# Patient Record
Sex: Female | Born: 1995 | Race: White | Hispanic: No | Marital: Single | Smoking: Never smoker
Health system: Southern US, Community
[De-identification: ages and names within clinical notes are randomized; demographics above are authoritative.]

## PROBLEM LIST (undated history)

## (undated) DIAGNOSIS — I69359 Hemiplegia and hemiparesis following cerebral infarction affecting unspecified side: Secondary | ICD-10-CM

## (undated) DIAGNOSIS — F32A Depression, unspecified: Secondary | ICD-10-CM

## (undated) DIAGNOSIS — I619 Nontraumatic intracerebral hemorrhage, unspecified: Secondary | ICD-10-CM

## (undated) DIAGNOSIS — G8194 Hemiplegia, unspecified affecting left nondominant side: Secondary | ICD-10-CM

## (undated) DIAGNOSIS — F909 Attention-deficit hyperactivity disorder, unspecified type: Secondary | ICD-10-CM

## (undated) DIAGNOSIS — M2142 Flat foot [pes planus] (acquired), left foot: Secondary | ICD-10-CM

## (undated) DIAGNOSIS — M2120 Flexion deformity, unspecified site: Secondary | ICD-10-CM

## (undated) DIAGNOSIS — G40109 Localization-related (focal) (partial) symptomatic epilepsy and epileptic syndromes with simple partial seizures, not intractable, without status epilepticus: Secondary | ICD-10-CM

## (undated) DIAGNOSIS — F419 Anxiety disorder, unspecified: Secondary | ICD-10-CM

## (undated) DIAGNOSIS — M216X2 Other acquired deformities of left foot: Secondary | ICD-10-CM

## (undated) DIAGNOSIS — G801 Spastic diplegic cerebral palsy: Secondary | ICD-10-CM

## (undated) DIAGNOSIS — F329 Major depressive disorder, single episode, unspecified: Secondary | ICD-10-CM

## (undated) HISTORY — PX: OTHER SURGICAL HISTORY: SHX169

## (undated) HISTORY — DX: Nontraumatic intracerebral hemorrhage, unspecified: I61.9

## (undated) HISTORY — DX: Attention-deficit hyperactivity disorder, unspecified type: F90.9

## (undated) HISTORY — DX: Flat foot (pes planus) (acquired), left foot: M21.42

## (undated) HISTORY — DX: Hemiplegia and hemiparesis following cerebral infarction affecting unspecified side: I69.359

## (undated) HISTORY — DX: Hemiplegia, unspecified affecting left nondominant side: G81.94

## (undated) HISTORY — DX: Localization-related (focal) (partial) symptomatic epilepsy and epileptic syndromes with simple partial seizures, not intractable, without status epilepticus: G40.109

## (undated) HISTORY — DX: Flexion deformity, unspecified site: M21.20

## (undated) HISTORY — DX: Major depressive disorder, single episode, unspecified: F32.9

## (undated) HISTORY — DX: Other acquired deformities of left foot: M21.6X2

## (undated) HISTORY — DX: Spastic diplegic cerebral palsy: G80.1

## (undated) HISTORY — DX: Depression, unspecified: F32.A

## (undated) HISTORY — DX: Anxiety disorder, unspecified: F41.9

---
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
## 2017-01-07 ENCOUNTER — Encounter (HOSPITAL_COMMUNITY): Payer: Self-pay | Admitting: Medical

## 2017-01-07 ENCOUNTER — Ambulatory Visit (INDEPENDENT_AMBULATORY_CARE_PROVIDER_SITE_OTHER): Payer: 59 | Admitting: Medical

## 2017-01-07 VITALS — BP 120/72 | HR 92 | Resp 16 | Ht 58.5 in | Wt 203.0 lb

## 2017-01-07 DIAGNOSIS — F509 Eating disorder, unspecified: Secondary | ICD-10-CM

## 2017-01-07 DIAGNOSIS — M216X2 Other acquired deformities of left foot: Secondary | ICD-10-CM | POA: Diagnosis not present

## 2017-01-07 DIAGNOSIS — G40109 Localization-related (focal) (partial) symptomatic epilepsy and epileptic syndromes with simple partial seizures, not intractable, without status epilepticus: Secondary | ICD-10-CM

## 2017-01-07 DIAGNOSIS — I69359 Hemiplegia and hemiparesis following cerebral infarction affecting unspecified side: Secondary | ICD-10-CM | POA: Diagnosis not present

## 2017-01-07 DIAGNOSIS — G43809 Other migraine, not intractable, without status migrainosus: Secondary | ICD-10-CM | POA: Diagnosis not present

## 2017-01-07 DIAGNOSIS — H60391 Other infective otitis externa, right ear: Secondary | ICD-10-CM | POA: Diagnosis not present

## 2017-01-07 DIAGNOSIS — F840 Autistic disorder: Secondary | ICD-10-CM

## 2017-01-07 DIAGNOSIS — F639 Impulse disorder, unspecified: Secondary | ICD-10-CM

## 2017-01-07 DIAGNOSIS — F411 Generalized anxiety disorder: Secondary | ICD-10-CM

## 2017-01-07 DIAGNOSIS — M24522 Contracture, left elbow: Secondary | ICD-10-CM | POA: Diagnosis not present

## 2017-01-07 DIAGNOSIS — F902 Attention-deficit hyperactivity disorder, combined type: Secondary | ICD-10-CM | POA: Diagnosis not present

## 2017-01-07 DIAGNOSIS — Z8659 Personal history of other mental and behavioral disorders: Secondary | ICD-10-CM

## 2017-01-07 DIAGNOSIS — G801 Spastic diplegic cerebral palsy: Secondary | ICD-10-CM

## 2017-01-07 MED ORDER — LISDEXAMFETAMINE DIMESYLATE 60 MG PO CAPS: 60.0000 mg | ORAL_CAPSULE | ORAL | 0 refills | Status: DC

## 2017-01-07 MED ORDER — FLUOXETINE HCL 20 MG PO CAPS: 20.0000 mg | ORAL_CAPSULE | Freq: Every day | ORAL | 3 refills | Status: DC

## 2017-01-07 MED ORDER — LISDEXAMFETAMINE DIMESYLATE 60 MG PO CAPS
60.0000 mg | ORAL_CAPSULE | ORAL | 0 refills | Status: DC
Start: 2017-01-07 — End: 2017-04-01

## 2017-01-07 MED ORDER — FLUOXETINE HCL 40 MG PO CAPS: 40.0000 mg | ORAL_CAPSULE | Freq: Every day | ORAL | 3 refills | Status: DC

## 2017-01-07 MED ORDER — TOPIRAMATE 50 MG PO TABS: 50.0000 mg | ORAL_TABLET | Freq: Two times a day (BID) | ORAL | 1 refills | Status: DC
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
## 2017-03-22 DIAGNOSIS — G4733 Obstructive sleep apnea (adult) (pediatric): Secondary | ICD-10-CM | POA: Insufficient documentation

## 2017-04-01 ENCOUNTER — Ambulatory Visit (INDEPENDENT_AMBULATORY_CARE_PROVIDER_SITE_OTHER): Payer: 59 | Admitting: Medical

## 2017-04-01 ENCOUNTER — Encounter (HOSPITAL_COMMUNITY): Payer: Self-pay | Admitting: Medical

## 2017-04-01 VITALS — BP 132/80 | HR 91 | Ht 60.0 in | Wt 208.0 lb

## 2017-04-01 DIAGNOSIS — F509 Eating disorder, unspecified: Secondary | ICD-10-CM

## 2017-04-01 DIAGNOSIS — G801 Spastic diplegic cerebral palsy: Secondary | ICD-10-CM | POA: Diagnosis not present

## 2017-04-01 DIAGNOSIS — I69359 Hemiplegia and hemiparesis following cerebral infarction affecting unspecified side: Secondary | ICD-10-CM | POA: Diagnosis not present

## 2017-04-01 DIAGNOSIS — Z818 Family history of other mental and behavioral disorders: Secondary | ICD-10-CM

## 2017-04-01 DIAGNOSIS — G4733 Obstructive sleep apnea (adult) (pediatric): Secondary | ICD-10-CM | POA: Diagnosis not present

## 2017-04-01 DIAGNOSIS — F902 Attention-deficit hyperactivity disorder, combined type: Secondary | ICD-10-CM

## 2017-04-01 DIAGNOSIS — M216X2 Other acquired deformities of left foot: Secondary | ICD-10-CM

## 2017-04-01 DIAGNOSIS — Z8659 Personal history of other mental and behavioral disorders: Secondary | ICD-10-CM | POA: Diagnosis not present

## 2017-04-01 DIAGNOSIS — M24522 Contracture, left elbow: Secondary | ICD-10-CM

## 2017-04-01 DIAGNOSIS — Q681 Congenital deformity of finger(s) and hand: Secondary | ICD-10-CM | POA: Diagnosis not present

## 2017-04-01 DIAGNOSIS — G40109 Localization-related (focal) (partial) symptomatic epilepsy and epileptic syndromes with simple partial seizures, not intractable, without status epilepticus: Secondary | ICD-10-CM | POA: Diagnosis not present

## 2017-04-01 DIAGNOSIS — Z79899 Other long term (current) drug therapy: Secondary | ICD-10-CM

## 2017-04-01 DIAGNOSIS — Z68.41 Body mass index (BMI) pediatric, greater than or equal to 95th percentile for age: Secondary | ICD-10-CM

## 2017-04-01 DIAGNOSIS — F840 Autistic disorder: Secondary | ICD-10-CM

## 2017-04-01 MED ORDER — FLUOXETINE HCL 40 MG PO CAPS: 40.0000 mg | ORAL_CAPSULE | Freq: Every day | ORAL | 3 refills | Status: DC

## 2017-04-01 MED ORDER — LISDEXAMFETAMINE DIMESYLATE 60 MG PO CAPS: 60.0000 mg | ORAL_CAPSULE | ORAL | 0 refills | Status: DC

## 2017-04-01 MED ORDER — LAMOTRIGINE 150 MG PO TABS: 150.0000 mg | ORAL_TABLET | Freq: Three times a day (TID) | ORAL | 0 refills | Status: DC

## 2017-04-01 MED ORDER — FLUOXETINE HCL 20 MG PO CAPS: 20.0000 mg | ORAL_CAPSULE | Freq: Every day | ORAL | 3 refills | Status: DC
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
## 2017-06-24 ENCOUNTER — Ambulatory Visit (HOSPITAL_COMMUNITY): Payer: Self-pay | Admitting: Medical

## 2017-07-01 ENCOUNTER — Other Ambulatory Visit (HOSPITAL_COMMUNITY): Payer: Self-pay | Admitting: Medical

## 2017-07-01 ENCOUNTER — Ambulatory Visit (INDEPENDENT_AMBULATORY_CARE_PROVIDER_SITE_OTHER): Payer: 59 | Admitting: Medical

## 2017-07-01 ENCOUNTER — Encounter (HOSPITAL_COMMUNITY): Payer: Self-pay | Admitting: Medical

## 2017-07-01 VITALS — BP 126/86 | HR 105 | Ht 60.0 in | Wt 220.0 lb

## 2017-07-01 DIAGNOSIS — M24522 Contracture, left elbow: Secondary | ICD-10-CM

## 2017-07-01 DIAGNOSIS — G40109 Localization-related (focal) (partial) symptomatic epilepsy and epileptic syndromes with simple partial seizures, not intractable, without status epilepticus: Secondary | ICD-10-CM | POA: Diagnosis not present

## 2017-07-01 DIAGNOSIS — Z8659 Personal history of other mental and behavioral disorders: Secondary | ICD-10-CM

## 2017-07-01 DIAGNOSIS — I69359 Hemiplegia and hemiparesis following cerebral infarction affecting unspecified side: Secondary | ICD-10-CM

## 2017-07-01 DIAGNOSIS — M216X2 Other acquired deformities of left foot: Secondary | ICD-10-CM

## 2017-07-01 DIAGNOSIS — G43809 Other migraine, not intractable, without status migrainosus: Secondary | ICD-10-CM

## 2017-07-01 DIAGNOSIS — G4733 Obstructive sleep apnea (adult) (pediatric): Secondary | ICD-10-CM

## 2017-07-01 DIAGNOSIS — F902 Attention-deficit hyperactivity disorder, combined type: Secondary | ICD-10-CM | POA: Diagnosis not present

## 2017-07-01 DIAGNOSIS — G801 Spastic diplegic cerebral palsy: Secondary | ICD-10-CM

## 2017-07-01 DIAGNOSIS — Q681 Congenital deformity of finger(s) and hand: Secondary | ICD-10-CM | POA: Diagnosis not present

## 2017-07-01 DIAGNOSIS — F639 Impulse disorder, unspecified: Secondary | ICD-10-CM | POA: Diagnosis not present

## 2017-07-01 DIAGNOSIS — F840 Autistic disorder: Secondary | ICD-10-CM

## 2017-07-01 MED ORDER — LISDEXAMFETAMINE DIMESYLATE 60 MG PO CAPS: 60.0000 mg | ORAL_CAPSULE | ORAL | 0 refills | Status: DC

## 2017-07-01 MED ORDER — TOPIRAMATE 50 MG PO TABS: 50.0000 mg | ORAL_TABLET | Freq: Two times a day (BID) | ORAL | 1 refills | Status: DC

## 2017-07-01 MED ORDER — FLUOXETINE HCL 20 MG PO CAPS: 20.0000 mg | ORAL_CAPSULE | Freq: Every day | ORAL | 3 refills | Status: DC

## 2017-07-01 MED ORDER — FLUOXETINE HCL 40 MG PO CAPS: 40.0000 mg | ORAL_CAPSULE | Freq: Every day | ORAL | 3 refills | Status: DC

## 2017-07-01 NOTE — Progress Notes (Addendum)
[facility] MD/PA/NP OP Progress Note  07/01/2017 4:57 PM Diane Barr  MRN:  [id number]  Chief Complaint:  Chief Complaint    Follow-up; Cerebral Palsy; ADHD; Agitation; Obesity; HX of Autism     HPI: At Prior visit 01/07/2017: Diane Barr returns for 3 month FU for ADHD .She is followed by Neurology for her CP and its complications. Mom say she hasnt had a Barr in 2 years.Since she has turned 20 she will be transferring from Pediatric to Adult Neurology. Mom is hoping Barr medications can be adjusted.  Diane Barr isnt well today-she just came from PCP office with a Rt painful swimmer's ear from showering-They are on way to pharmacy after visit here . Diane Barr is in year of transition having finished High School. Mom says they are working on a plan.  At Last Visit 04/01/2017 Pt returns with her father who reports she has been to neurologist and sh is weaning off Trileptal but will keep her Lamictal based on EEG findings. Dad notes that since Diane Barr has been out of school a lot her emotional behavioral problems have disappeared with the removal of the stressors of school. She is currently relaxing at home but getting ready to fill out some job apps and also to pick a course to take in Fall. Diane Barr confirms . She has also had a home sleep study suggesting she has sleep apnea and i scheduled for overnite evaluation.She does sleep during daytime but Dad feels much of this due to lack of sleep from the Apnea.  Today:Diane Barr returns with Mom and reports she is doing "nothing" and enjoying her time away from school. She is babysitting her niece who is almost 28 yr old now. Plans to pick a course Online fr[employer] this fall just to try it out.  She did get her CPAP and her sleep is now good .Daytime somnolence is gone. Mom is working on Diane Barr and Disability now. Diane Barr with taper on Lamictal and discontinuation of Trileptal.Diane Barr for got to take her Lamictal that day but Neurology  did bump her Lamictal back up and Diane Barr has set her alarm to remind her Mom has also noticed the return of some anger control issues and irritability with the discontinuation of Trileptal. Mom was hoping to reinstitute this rx here today  Remainder of medications have been no problem.  Visit Diagnosis:    ICD-10-CM   1. History of autism spectrum disorder Z86.59   2. ADHD (attention deficit hyperactivity disorder), combined type F90.2   3. 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 Acquired equinus deformity of left foot M21.6X2   7. 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 Obstructive sleep apnea syndrome G47.33   10. Impulse control disorder F63.9   11. Other migraine without status migrainosus, not intractable G43.809     Allergies:  Allergies  Allergen Reactions  . Peanuts [Peanut Oil] Anaphylaxis  . Other Other (See Comments)    Anti Psychotics induce seizures PLUMS, APPLES & GRAPES, WITH SEEDSNFoodmouth tingles Anti Psychotics induce seizures Anti Psychotics induce seizures PLUMS, APPLES & GRAPES, WITH SEEDSNFoodmouth tingles Anti Psychotics induce seizures    Metabolic Disorder Labs:  Glucose 82 65 - 99 mg/dL   Lipid Panel  Component Value Ref Range Performed At  Cholesterol, Total 163 100 - 169 mg/dL [facility]  Triglycerides 85 0 -  89 mg/dL [facility]  HDL 42 >40 mg/dL [facility]  VLDL Cholesterol Cal 17 5 - 40 mg/dL [facility]  LDL Calculated 104 0 - 109 mg/dL [facility]   Lipid Panel  Narrative    No results found for: TSH TSH,Ultra Sensitive TSH,Ultra Sensitive  Component Value Ref Range Performed At  TSH 3.656 0.40 - 5.50 uIU/ML [facility]   TSH,Ultra Sensitive  Specimen  Blood   TSH,Ultra Sensitive  Performing Organization Address City/State/Zipcode Phone Number  [facility]  Clinical Pathology Lab, CLIA#  [id number], [facility], [place] [address]    [facility]  [facility], [facility] [place], [place] [address]    Back to top of Lab Results    Free Thyroxine (Free T4) Free Thyroxine (Free T4)  Component Value Ref Range Performed At  FREE THYROXINE 0.9 0.6 - 1.75 NG/DL [facility]   Free Thyroxine (Free T4)  Specimen  Blood    Current Medications: Current Outpatient Medications  Medication Sig Dispense Refill  . FLUCELVAX QUADRIVALENT 0.5 ML SUSY INJECT 0.5ML INTRAMUSCULARLY ONCE  0  . lamoTRIgine (LAMICTAL) 150 MG tablet Take 1 tablet (150 mg total) by mouth 3 (three) times daily. 270 tablet 0  . midazolam (VERSED) 10 MG/2ML SOLN injection Place 10 mg into the nose daily as needed for Barr. Reported on 07/03/2015  5  . prochlorperazine (COMPAZINE) 10 MG tablet Take 10 mg by mouth as needed for headache, Barr or headache. Take with Naprosuyn 550 mg    . SUMAtriptan (IMITREX) 50 MG tablet Take 50 mg by mouth every 2 (two) hours as needed. May repeat in 2 hours if headache persists or recurs.    Marland Kitchen FLUoxetine (PROZAC) 20 MG capsule Take 1 capsule (20 mg total) by mouth daily. TAKE 1 CAPSULE DAILY (TAKE WITH 40 MG CAPSULE FOR TOTAL OF 60 MG DAILY) 90 capsule 3  . FLUoxetine (PROZAC) 40 MG capsule Take 1 capsule (40 mg total) by mouth daily. 90 capsule 3  . lisdexamfetamine (VYVANSE) 60 MG capsule Take 1 capsule (60 mg total) by mouth every morning. DNFU 09/03/2017 30 capsule 0  . lisdexamfetamine (VYVANSE) 60 MG capsule Take 1 capsule (60 mg total) by mouth every morning. 30 capsule 0  . lisdexamfetamine (VYVANSE) 60 MG capsule Take 1 capsule (60 mg total) by mouth every morning. DNFU 08/03/2017 30 capsule 0  . topiramate (TOPAMAX) 50 MG tablet Take 1 tablet (50 mg total) by mouth 2 (two) times daily. 180 tablet 1   No current facility-administered medications for this visit.      Musculoskeletal: Strength & Muscle Tone: within  normal limits except hemiparesis with spasticity and contracture of left elbow CP related Gait & Station: normal except ataxic, due to Acquired equinus deformity of left foot CP related Patient leans: N/A  Psychiatric Specialty Exam: Review of Systems  Constitutional: Negative for chills, diaphoresis, fever, malaise/fatigue and weight loss.  Musculoskeletal: Negative for back pain, falls, joint pain, myalgias and neck pain.  Neurological: Positive for seizures. Negative for dizziness, tingling, tremors, sensory change, speech change, focal weakness, loss of consciousness, weakness and headaches.  Psychiatric/Behavioral: Positive for depression. Negative for hallucinations, memory loss, substance abuse and suicidal ideas. The patient is nervous/anxious. The patient does not have insomnia.     Blood pressure 126/86, pulse (!) 105, height 5' (1.524 m), weight 220 lb (99.8 kg).Body mass index is 42.97 kg/m.  General Appearance: Casual and Well Groomed CP obvious  Eye Contact:  Good  Speech:  Clear and Coherent  Volume:  Normal  Mood:  Euthymic  Affect:  Congruent  Thought Process:  Coherent and Descriptions of Associations: Intact  Orientation:  Full (Time, Place, and Person)  Thought Content: Logical   Suicidal Thoughts:  No  Homicidal Thoughts:  No  Memory:  Negative  Judgement:  Fair  Insight:  Fair  Psychomotor Activity:  Normal and with CP/CVAimpairments  Concentration:  Concentration: Good and Attention Span: Good  Recall:  Good  Fund of Knowledge: Good  Language: Good  Akathisia:  NA  Handed:  Right  AIMS (if indicated): NA  Assets:  ArchitectCommunication Skills Financial Resources/Insurance Leisure Time Resilience Social Print production plannerupport Transportation Vocational/Educational  ADL's:  Impaired  Cognition: Impaired,  Moderate  Sleep:  Good     Assessment : Other than problems related to the tapering of her antiseizure medications Diane Barr is stable and doing well  and Plan:   Autism/ADHD  Mood/behaviors Prozac 60 mg/Lamictal 150 mg BID (prescribed by Neurology) Discussed with Mom her desire to resume Trileptal for Diane Barr's mood-Diane Barr has Neurology FU next week and Mom will discuss thuis with them.If they approve of Psychiatric rx I will resume some level of Trileptal or they may choose to put her back Neurologically. Mom will call to let us know  Eating disorder  Topomax/Nutrition services   CVA and complications   continue FU with PCP/Specialists for conditions  FU 3 months   Diane Mornharles Kaytie Ratcliffe, PA-C 07/01/2017, 4:57 PM

## 2017-07-01 NOTE — Progress Notes (Unsigned)
Diagnosis  Sleep apnea, obstructive - Primary  Obstructive sleep apnea (adult) (pediatric) 03/22/2017

## 2017-07-01 NOTE — Progress Notes (Unsigned)
BH MD/PA/NP OP Progress Note  07/01/2017 4:19 PM Diane Barr  MRN:  [id number]  Chief Complaint:  HPI: *** Visit Diagnosis:    ICD-10-CM   1. Obstructive sleep apnea G47.33 CPAP continuous - face mask    Past Psychiatric History: ***  Past Medical History:  Past Medical History:[facility]  Diagnosis Date  . Acquired equinus deformity of left foot    CP related  . Acquired pes planovalgus of left foot    CP related  . ADHD (attention deficit hyperactivity disorder)   . Anxiety   . CP (cerebral palsy), spastic (HCC) birth   CVA related  . CVA (cerebrovascular accident due to intracerebral hemorrhage) (HCC) at birth  . CVA, old, hemiparesis (HCC) birth   CP  . Depression   . Epilepsy, localization-related (HCC)    with complex partial seizures,without mention of intractable epilepsy  . Flexion deformity    Lt upper extremity  . 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 EEG 2009: right fronto-polar spikes; EEG 2014: same.  Medications tried: Lamictal.    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 multiple repairs on arm and leg      Family Psychiatric History: ***  Family History:  Family History  Problem Relation Age of Onset  . ADD / ADHD Father   . ADD / ADHD Sister     Social History:  Social History   Socioeconomic History  . Marital status: Single    Spouse name: Not on file  . Number of children: Not on file  . Years of education: Not on file  . Highest education level: Not on file  Occupational History  . Not on file  Social Needs  . Financial resource strain: Not on file  . Food insecurity:    Worry: Not on file    Inability: Not on file  . Transportation needs:    Medical: Not on file    Non-medical: Not on file  Tobacco Use  . Smoking status: Never Smoker  . Smokeless tobacco: Never Used  Substance and Sexual Activity  . Alcohol use: No  . Drug use: No  . Sexual activity: Never  Lifestyle  . Physical activity:    Days per week: Not on file    Minutes per session: Not on file  . Stress: Not on file  Relationships  . Social connections:    Talks on phone: Not on file    Gets together: Not  on file    Attends religious service: Not on file    Active member of club or organization: Not on file    Attends meetings of clubs or organizations: Not on file    Relationship status: Not on file  Other Topics Concern  . Not on file  Social History Narrative  . Not on file    Allergies:  Allergies  Allergen Reactions  . Peanuts [Peanut Oil] Anaphylaxis  . Other Other (See Comments)    Anti Psychotics induce seizures PLUMS, APPLES & GRAPES, WITH SEEDSNFoodmouth tingles Anti Psychotics induce seizures Anti Psychotics induce seizures PLUMS, APPLES & GRAPES, WITH SEEDSNFoodmouth tingles Anti Psychotics induce seizures    Metabolic  Disorder Labs: No results found for: HGBA1C, MPG No results found for: PROLACTIN No results found for: CHOL, TRIG, HDL, CHOLHDL, VLDL, LDLCALC No results found for: TSH  Therapeutic Level Labs: No results found for: LITHIUM No results found for: VALPROATE No components found for:  CBMZ  Current Medications: Current Outpatient Medications  Medication Sig Dispense Refill  . FLUCELVAX QUADRIVALENT 0.5 ML SUSY INJECT 0.5ML INTRAMUSCULARLY ONCE  0  . FLUoxetine (PROZAC) 20 MG capsule Take 1 capsule (20 mg total) by mouth daily. TAKE 1 CAPSULE DAILY (TAKE WITH 40 MG CAPSULE FOR TOTAL OF 60 MG DAILY) 90 capsule 3  . FLUoxetine (PROZAC) 40 MG capsule Take 1 capsule (40 mg total) by mouth daily. 90 capsule 3  . lamoTRIgine (LAMICTAL) 150 MG tablet Take 1 tablet (150 mg total) by mouth 3 (three) times daily. 270 tablet 0  . lisdexamfetamine (VYVANSE) 60 MG capsule Take 1 capsule (60 mg total) by mouth every morning. DNFU 09/03/2017 30 capsule 0  . lisdexamfetamine (VYVANSE) 60 MG capsule Take 1 capsule (60 mg total) by mouth every morning. 30 capsule 0  . lisdexamfetamine (VYVANSE) 60 MG capsule Take 1 capsule (60 mg total) by mouth every morning. DNFU 08/03/2017 30 capsule 0  . midazolam (VERSED) 10 MG/2ML SOLN injection Place 10 mg into the nose daily as needed for seizure. Reported on 07/03/2015  5  . prochlorperazine (COMPAZINE) 10 MG tablet Take 10 mg by mouth as needed for headache, seizure or headache. Take with Naprosuyn 550 mg    . SUMAtriptan (IMITREX) 50 MG tablet Take 50 mg by mouth every 2 (two) hours as needed. May repeat in 2 hours if headache persists or recurs.    . topiramate (TOPAMAX) 50 MG tablet Take 1 tablet (50 mg total) by mouth 2 (two) times daily. 180 tablet 1   No current facility-administered medications for this visit.      Musculoskeletal: Strength & Muscle Tone: {desc; muscle tone:32375} Gait & Station: {PE GAIT ED ZOXW:96045} Patient leans: {Patient  Leans:21022755}  Psychiatric Specialty Exam: ROS  There were no vitals taken for this visit.There is no height or weight on file to calculate BMI.  General Appearance: {Appearance:22683}  Eye Contact:  {BHH EYE CONTACT:22684}  Speech:  {Speech:22685}  Volume:  {Volume (PAA):22686}  Mood:  {BHH MOOD:22306}  Affect:  {Affect (PAA):22687}  Thought Process:  {Thought Process (PAA):22688}  Orientation:  {BHH ORIENTATION (PAA):22689}  Thought Content: {Thought Content:22690}   Suicidal Thoughts:  {ST/HT (PAA):22692}  Homicidal Thoughts:  {ST/HT (PAA):22692}  Memory:  {BHH MEMORY:22881}  Judgement:  {Judgement (PAA):22694}  Insight:  {Insight (PAA):22695}  Psychomotor Activity:  {Psychomotor (PAA):22696}  Concentration:  {Concentration:21399}  Recall:  {BHH GOOD/FAIR/POOR:22877}  Fund of Knowledge: {BHH GOOD/FAIR/POOR:22877}  Language: {BHH GOOD/FAIR/POOR:22877}  Akathisia:  {BHH YES OR WU:98119}  Handed:  {Handed:22697}  AIMS (if indicated): {Desc; done/not:10129}  Assets:  {Assets (PAA):22698}  ADL's:  {BHH WGN'F:62130}ADL'S:22290}  Cognition: {chl bhh cognition:304700322}  Sleep:  {BHH GOOD/FAIR/POOR:22877}   Screenings:   Assessment and Plan: ***   Maryjean Mornharles Angeldejesus Callaham, PA-C 07/01/2017, 4:19 PM

## 2017-07-02 ENCOUNTER — Encounter (HOSPITAL_COMMUNITY): Payer: Self-pay | Admitting: Medical

## 2017-07-15 ENCOUNTER — Telehealth (HOSPITAL_COMMUNITY): Payer: Self-pay | Admitting: Medical

## 2017-07-15 NOTE — Telephone Encounter (Signed)
Pt needs prior auth on vyvanse.   cb at [phone]

## 2017-07-15 NOTE — Telephone Encounter (Signed)
Patients' mom called back with RX information. [facility] RX ID# [id number] BIN# [id number] PCN# [id number] GRP# [id number]  Prior Authorization done on [email or web address] Prior authorization was approved. Informed patient mom and faxed approval letter to pharmacy

## 2017-07-15 NOTE — Telephone Encounter (Signed)
Called and left VM informing mom that I need prescription drug information off the insurance card in order to do a prior authorization.

## 2017-07-18 ENCOUNTER — Other Ambulatory Visit (HOSPITAL_COMMUNITY): Payer: Self-pay | Admitting: Medical

## 2017-07-21 ENCOUNTER — Other Ambulatory Visit (HOSPITAL_COMMUNITY): Payer: Self-pay | Admitting: Medical

## 2017-07-21 ENCOUNTER — Telehealth (HOSPITAL_COMMUNITY): Payer: Self-pay

## 2017-07-21 MED ORDER — LAMOTRIGINE 150 MG PO TABS: 150.0000 mg | ORAL_TABLET | Freq: Three times a day (TID) | ORAL | 0 refills | Status: DC

## 2017-07-21 NOTE — Telephone Encounter (Signed)
Pharmacy sent fax requesting 90 day refill on Lamictal. Last refill was on 04/01/17. Next appointment is on 09/23/17.

## 2017-07-21 NOTE — Telephone Encounter (Signed)
rx sent to [facility] on file

## 2017-08-12 ENCOUNTER — Other Ambulatory Visit (HOSPITAL_COMMUNITY): Payer: Self-pay | Admitting: Medical

## 2017-09-23 ENCOUNTER — Ambulatory Visit (HOSPITAL_COMMUNITY): Payer: Self-pay | Admitting: Medical

## 2017-10-28 ENCOUNTER — Ambulatory Visit (HOSPITAL_COMMUNITY): Payer: Self-pay | Admitting: Medical

## 2017-11-04 ENCOUNTER — Ambulatory Visit (INDEPENDENT_AMBULATORY_CARE_PROVIDER_SITE_OTHER): Payer: 59 | Admitting: Medical

## 2017-11-04 ENCOUNTER — Encounter (HOSPITAL_COMMUNITY): Payer: Self-pay | Admitting: Medical

## 2017-11-04 VITALS — BP 130/70 | HR 90 | Ht 60.0 in | Wt 222.0 lb

## 2017-11-04 DIAGNOSIS — F902 Attention-deficit hyperactivity disorder, combined type: Secondary | ICD-10-CM

## 2017-11-04 DIAGNOSIS — Z8659 Personal history of other mental and behavioral disorders: Secondary | ICD-10-CM | POA: Diagnosis not present

## 2017-11-04 DIAGNOSIS — F411 Generalized anxiety disorder: Secondary | ICD-10-CM

## 2017-11-04 DIAGNOSIS — G40109 Localization-related (focal) (partial) symptomatic epilepsy and epileptic syndromes with simple partial seizures, not intractable, without status epilepticus: Secondary | ICD-10-CM

## 2017-11-04 DIAGNOSIS — G801 Spastic diplegic cerebral palsy: Secondary | ICD-10-CM | POA: Diagnosis not present

## 2017-11-04 DIAGNOSIS — M24522 Contracture, left elbow: Secondary | ICD-10-CM

## 2017-11-04 DIAGNOSIS — G43809 Other migraine, not intractable, without status migrainosus: Secondary | ICD-10-CM

## 2017-11-04 DIAGNOSIS — F5081 Binge eating disorder: Secondary | ICD-10-CM

## 2017-11-04 DIAGNOSIS — I69359 Hemiplegia and hemiparesis following cerebral infarction affecting unspecified side: Secondary | ICD-10-CM

## 2017-11-04 DIAGNOSIS — G4733 Obstructive sleep apnea (adult) (pediatric): Secondary | ICD-10-CM

## 2017-11-04 DIAGNOSIS — Q681 Congenital deformity of finger(s) and hand: Secondary | ICD-10-CM

## 2017-11-04 DIAGNOSIS — M216X2 Other acquired deformities of left foot: Secondary | ICD-10-CM

## 2017-11-04 DIAGNOSIS — F840 Autistic disorder: Secondary | ICD-10-CM

## 2017-11-04 MED ORDER — LISDEXAMFETAMINE DIMESYLATE 60 MG PO CAPS: 60.0000 mg | ORAL_CAPSULE | ORAL | 0 refills | Status: DC

## 2017-11-04 MED ORDER — LAMOTRIGINE 150 MG PO TABS: 150.0000 mg | ORAL_TABLET | Freq: Three times a day (TID) | ORAL | 2 refills | Status: DC
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
## 2018-02-07 ENCOUNTER — Other Ambulatory Visit (HOSPITAL_COMMUNITY): Payer: Self-pay | Admitting: Medical

## 2018-02-07 ENCOUNTER — Telehealth (HOSPITAL_COMMUNITY): Payer: Self-pay

## 2018-02-07 MED ORDER — LISDEXAMFETAMINE DIMESYLATE 60 MG PO CAPS: 60.0000 mg | ORAL_CAPSULE | ORAL | 0 refills | Status: DC

## 2018-02-07 NOTE — Telephone Encounter (Signed)
RX sent to [facility]

## 2018-02-07 NOTE — Telephone Encounter (Signed)
Mom called requesting a refill for vyvanse. [facility] [place]

## 2018-02-08 NOTE — Telephone Encounter (Signed)
Informed mom that medication was sent to the pharmacy. 

## 2018-02-10 ENCOUNTER — Ambulatory Visit (INDEPENDENT_AMBULATORY_CARE_PROVIDER_SITE_OTHER): Payer: 59 | Admitting: Medical

## 2018-02-10 ENCOUNTER — Encounter (HOSPITAL_COMMUNITY): Payer: Self-pay | Admitting: Medical

## 2018-02-10 ENCOUNTER — Other Ambulatory Visit: Payer: Self-pay

## 2018-02-10 VITALS — BP 130/88 | HR 88 | Ht 60.0 in | Wt 227.0 lb

## 2018-02-10 DIAGNOSIS — F840 Autistic disorder: Secondary | ICD-10-CM

## 2018-02-10 DIAGNOSIS — G801 Spastic diplegic cerebral palsy: Secondary | ICD-10-CM | POA: Diagnosis not present

## 2018-02-10 DIAGNOSIS — F639 Impulse disorder, unspecified: Secondary | ICD-10-CM

## 2018-02-10 DIAGNOSIS — F902 Attention-deficit hyperactivity disorder, combined type: Secondary | ICD-10-CM

## 2018-02-10 DIAGNOSIS — Q681 Congenital deformity of finger(s) and hand: Secondary | ICD-10-CM

## 2018-02-10 DIAGNOSIS — G40109 Localization-related (focal) (partial) symptomatic epilepsy and epileptic syndromes with simple partial seizures, not intractable, without status epilepticus: Secondary | ICD-10-CM

## 2018-02-10 DIAGNOSIS — I69359 Hemiplegia and hemiparesis following cerebral infarction affecting unspecified side: Secondary | ICD-10-CM

## 2018-02-10 DIAGNOSIS — Z68.41 Body mass index (BMI) pediatric, greater than or equal to 95th percentile for age: Secondary | ICD-10-CM

## 2018-02-10 DIAGNOSIS — G4733 Obstructive sleep apnea (adult) (pediatric): Secondary | ICD-10-CM

## 2018-02-10 DIAGNOSIS — M24522 Contracture, left elbow: Secondary | ICD-10-CM

## 2018-02-10 DIAGNOSIS — M216X2 Other acquired deformities of left foot: Secondary | ICD-10-CM

## 2018-02-10 DIAGNOSIS — F5081 Binge eating disorder: Secondary | ICD-10-CM | POA: Diagnosis not present

## 2018-02-10 MED ORDER — LISDEXAMFETAMINE DIMESYLATE 60 MG PO CAPS: 60.0000 mg | ORAL_CAPSULE | ORAL | 0 refills | Status: DC

## 2018-02-10 MED ORDER — FLUOXETINE HCL 20 MG PO CAPS: 20.0000 mg | ORAL_CAPSULE | Freq: Every day | ORAL | 3 refills | Status: DC

## 2018-02-10 MED ORDER — TOPIRAMATE 100 MG PO TABS: 100.0000 mg | ORAL_TABLET | Freq: Two times a day (BID) | ORAL | 1 refills | Status: DC

## 2018-02-10 MED ORDER — FLUOXETINE HCL 40 MG PO CAPS: 40.0000 mg | ORAL_CAPSULE | Freq: Every day | ORAL | 3 refills | Status: DC
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
## 2018-04-28 ENCOUNTER — Ambulatory Visit (HOSPITAL_COMMUNITY): Payer: 59 | Admitting: Medical

## 2018-05-05 ENCOUNTER — Other Ambulatory Visit: Payer: Self-pay

## 2018-05-05 ENCOUNTER — Ambulatory Visit (INDEPENDENT_AMBULATORY_CARE_PROVIDER_SITE_OTHER): Payer: 59 | Admitting: Medical

## 2018-05-05 ENCOUNTER — Encounter (HOSPITAL_COMMUNITY): Payer: Self-pay | Admitting: Medical

## 2018-05-05 VITALS — BP 112/80 | HR 80 | Ht 60.0 in | Wt 230.0 lb

## 2018-05-05 DIAGNOSIS — G4733 Obstructive sleep apnea (adult) (pediatric): Secondary | ICD-10-CM

## 2018-05-05 DIAGNOSIS — F639 Impulse disorder, unspecified: Secondary | ICD-10-CM

## 2018-05-05 DIAGNOSIS — F5081 Binge eating disorder: Secondary | ICD-10-CM

## 2018-05-05 DIAGNOSIS — M24522 Contracture, left elbow: Secondary | ICD-10-CM

## 2018-05-05 DIAGNOSIS — F902 Attention-deficit hyperactivity disorder, combined type: Secondary | ICD-10-CM

## 2018-05-05 DIAGNOSIS — F840 Autistic disorder: Secondary | ICD-10-CM | POA: Diagnosis not present

## 2018-05-05 DIAGNOSIS — IMO0002 Reserved for concepts with insufficient information to code with codable children: Secondary | ICD-10-CM

## 2018-05-05 DIAGNOSIS — Q681 Congenital deformity of finger(s) and hand: Secondary | ICD-10-CM

## 2018-05-05 DIAGNOSIS — G801 Spastic diplegic cerebral palsy: Secondary | ICD-10-CM

## 2018-05-05 DIAGNOSIS — I69359 Hemiplegia and hemiparesis following cerebral infarction affecting unspecified side: Secondary | ICD-10-CM

## 2018-05-05 DIAGNOSIS — M216X2 Other acquired deformities of left foot: Secondary | ICD-10-CM

## 2018-05-05 DIAGNOSIS — F50819 Binge eating disorder, unspecified: Secondary | ICD-10-CM

## 2018-05-05 DIAGNOSIS — Z68.41 Body mass index (BMI) pediatric, greater than or equal to 95th percentile for age: Secondary | ICD-10-CM

## 2018-05-05 MED ORDER — LAMOTRIGINE 150 MG PO TABS: 150.0000 mg | ORAL_TABLET | Freq: Three times a day (TID) | ORAL | 2 refills | Status: DC

## 2018-05-05 MED ORDER — LISDEXAMFETAMINE DIMESYLATE 60 MG PO CAPS: 60.0000 mg | ORAL_CAPSULE | ORAL | 0 refills | Status: DC

## 2018-05-05 NOTE — Progress Notes (Signed)
BH MD/PA/NP OP Progress Note  05/05/2018 5:53 PM Diane Barr  MRN:  [id number]  Chief Complaint:  Chief Complaint    Follow-up; Medication Refill; ADHD; Anxiety; Agitation     HPI: Diane Barr returns with her father today for scheduled 3 month FU and medication refills. Other than assigned work around the house she says she is doing "nothinh"<No height available> mentions they are establishing Guardianship for Diane Barr and preparing to file for SSDI because of her CP orthopedic complications and her Autism/ADHD with agitation.Dad says she has occasional outbursts that are manageable.They are happy with current medications.   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 Acquired equinus deformity of left foot    CP related  . Acquired pes planovalgus of left foot    CP related  . ADHD (attention deficit hyperactivity disorder)   . Anxiety   . CP (cerebral palsy), spastic (HCC) birth   CVA related  . CVA (cerebrovascular accident due to intracerebral hemorrhage) (HCC) at birth  . CVA, old, hemiparesis (HCC) birth   CP  . Depression   . Epilepsy, localization-related (HCC)    with complex partial seizures,without mention of intractable epilepsy  . Flexion deformity    Lt upper extremity  . 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 multiple repairs on arm and leg     Allergies:  Allergies  Allergen Reactions  . Peanuts [Peanut Oil] Anaphylaxis  . Other Other (See Comments)    Anti Psychotics induce seizures PLUMS, APPLES & GRAPES, WITH SEEDSNFoodmouth tingles Anti Psychotics induce seizures Anti Psychotics induce seizures PLUMS, APPLES & GRAPES, WITH SEEDSNFoodmouth tingles Anti Psychotics induce seizures    Metabolic Disorder Labs: No results found for: HGBA1C, MPG No results found for: PROLACTIN No results found for: CHOL, TRIG, HDL, CHOLHDL, VLDL, LDLCALC No results found for: TSH  Therapeutic Level Labs:NA  Current Medications: Current Outpatient Medications  Medication Sig Dispense Refill  . BOTOX 100 units SOLR injection     . FLUCELVAX QUADRIVALENT 0.5 ML SUSY INJECT 0.5ML INTRAMUSCULARLY ONCE  0  . FLUoxetine (PROZAC) 20 MG capsule Take 1 capsule (20 mg total) by mouth daily. TAKE 1 CAPSULE DAILY (TAKE WITH 40 MG CAPSULE FOR TOTAL OF 60 MG DAILY) 90 capsule 3  . FLUoxetine (PROZAC) 40 MG capsule Take 1 capsule (40 mg total) by mouth daily. 90 capsule 3  . ketorolac (TORADOL) 10 MG tablet Take by mouth.    . lamoTRIgine (LAMICTAL) 150 MG tablet Take 1 tablet (150 mg total) by mouth 3 (three) times daily. 270 tablet 2  . lisdexamfetamine (VYVANSE) 60 MG capsule Take 1 capsule (60 mg total) by mouth every morning. 30 capsule 0  . lisdexamfetamine (VYVANSE) 60 MG capsule Take 1 capsule (60 mg total) by mouth every morning. DNFU 07/01/2018 30 capsule 0  .  lisdexamfetamine (VYVANSE) 60 MG capsule Take 1 capsule (60 mg total) by mouth every morning. DNFU 05/31/18 30 capsule 0  . midazolam (VERSED) 10 MG/2ML SOLN injection Place 10 mg into the nose daily as needed for seizure. Reported on 07/03/2015  5  . naproxen sodium (ANAPROX) 550 MG tablet TAKE 1 TABLET WITH COMPAZINE FOR SEVERE HEADACHES MAY REPEAT IN 6 HOURS    . prochlorperazine (COMPAZINE) 10 MG tablet Take 10 mg by mouth as needed for headache,  seizure or headache. Take with Naprosuyn 550 mg    . prochlorperazine (COMPAZINE) 10 MG tablet Take 1 tablet with 1 naproxen 550 for severe headache.    . promethazine (PHENERGAN) 25 MG tablet Take by mouth.    . SUMAtriptan (IMITREX) 50 MG tablet Take 50 mg by mouth every 2 (two) hours as needed. May repeat in 2 hours if headache persists or recurs.    . topiramate (TOPAMAX) 100 MG tablet Take 1 tablet (100 mg total) by mouth 2 (two) times daily for 360 doses. 180 tablet 1   No current facility-administered medications for this visit.      Musculoskeletal: Strength & Muscle Tone: spastic Gait & Station: shuffle Patient leans: Left and Front  Psychiatric Specialty Exam: Review of Systems  Constitutional: Negative for chills, diaphoresis, fever, malaise/fatigue and weight loss.  Gastrointestinal: Negative for abdominal pain, blood in stool, constipation, diarrhea, heartburn, melena, nausea and vomiting.  Neurological: Positive for sensory change, focal weakness and seizures (rX tRILEPTAL). Negative for dizziness, tingling, tremors, speech change, weakness and headaches.  Psychiatric/Behavioral: Negative for depression, hallucinations, memory loss, substance abuse and suicidal ideas. The patient is nervous/anxious. The patient does not have insomnia.     Blood pressure 112/80, pulse 80, height 5' (1.524 m), weight 230 lb (104.3 kg).Body mass index is 44.92 kg/m.  General Appearance: Casual and Neat  Eye Contact:  Good  Speech:  Clear and Coherent  Volume:  Normal  Mood:  Euthymic  Affect:  Congruent  Thought Process:  Coherent and Descriptions of Associations: Intact  Orientation:  Full (Time, Place, and Person)  Thought Content: WDL   Suicidal Thoughts:  No  Homicidal Thoughts:  No  Memory:  Negative  Judgement:  Impaired  Insight:  Shallow  Psychomotor Activity:  CP injury  Concentration:  Concentration: Good and Attention Span: Good  Recall:  Good  Fund of Knowledge: WDL   Language: WDL  Akathisia:  Negative  Handed:  Right  AIMS (if indicated): NA  Assets:  Financial Resources/Insurance Housing Resilience Social Support Transportation Vocational/Educational  ADL's:  Intact  Cognition: Impaired,  Moderate  Sleep:  No complaint     Assessment and Plan: Diane Barr is at a standstill with her life at present due to her complicated mental and physical conditions. Medication wise she is stable. Parents are in process of establishing Guardianship and Diane Barr will apply for SSDI due to her CP complications and her Autism/ADHD with agitaion FU 3 months here. Continue with other providers AS SCHEDULED.   Maryjean Morn, PA-C 05/05/2018, 5:53 PM

## 2018-06-30 ENCOUNTER — Other Ambulatory Visit (HOSPITAL_COMMUNITY): Payer: Self-pay | Admitting: Medical

## 2018-07-28 ENCOUNTER — Ambulatory Visit (HOSPITAL_COMMUNITY): Payer: 59 | Admitting: Medical

## 2018-07-28 ENCOUNTER — Encounter (HOSPITAL_COMMUNITY): Payer: Self-pay | Admitting: Medical

## 2018-07-28 ENCOUNTER — Other Ambulatory Visit: Payer: Self-pay

## 2018-07-28 MED ORDER — TOPIRAMATE 100 MG PO TABS: 100.0000 mg | ORAL_TABLET | Freq: Two times a day (BID) | ORAL | 1 refills | Status: DC

## 2018-07-28 MED ORDER — LISDEXAMFETAMINE DIMESYLATE 60 MG PO CAPS: 60.0000 mg | ORAL_CAPSULE | ORAL | 0 refills | Status: DC

## 2018-07-28 NOTE — Progress Notes (Signed)
Patient ID: Diane Barr, female   DOB: November 13, 1995, 23 y.o.   MRN: [id number] Virtual Visit via Video Note   I was unable to connect with Diane Barr on 07/28/18 at  4:00 PM EDT by a video enabled telemedicine application a   I was not able discussed the limitations of evaluation and management by telemedicine and the availability of in person appointments. The patient expressed understanding and agreed to proceed.   History of Present Illness:Pt No show for scheduled for 3 month FU via WEBEX  Observations/Objective:     Assessment and Plan:     Follow Up Instructions:       I provided 0 minutes of non-face-to-face time during this encounter.     Maryjean Morn, PA-C   Patient ID: Diane Barr, female   DOB: 10-Oct-1995, 23 y.o.   MRN: [id number]    ADDENDUM: Pt's mother called at 4:30 pm She reported that she thought appt was at 4:30. She could not explain why phone wasnt answered

## 2018-07-29 ENCOUNTER — Telehealth (HOSPITAL_COMMUNITY): Payer: Self-pay

## 2018-07-29 MED ORDER — TOPIRAMATE 100 MG PO TABS: 100.0000 mg | ORAL_TABLET | Freq: Two times a day (BID) | ORAL | 1 refills | Status: DC

## 2018-07-29 NOTE — Telephone Encounter (Signed)
Resent topiramate to [facility] because that is where the patient gets the medication from.

## 2018-08-04 ENCOUNTER — Ambulatory Visit (INDEPENDENT_AMBULATORY_CARE_PROVIDER_SITE_OTHER): Payer: 59 | Admitting: Medical

## 2018-08-04 ENCOUNTER — Encounter (HOSPITAL_COMMUNITY): Payer: Self-pay | Admitting: Medical

## 2018-08-04 DIAGNOSIS — F840 Autistic disorder: Secondary | ICD-10-CM

## 2018-08-04 DIAGNOSIS — M24522 Contracture, left elbow: Secondary | ICD-10-CM

## 2018-08-04 DIAGNOSIS — Z68.41 Body mass index (BMI) pediatric, greater than or equal to 95th percentile for age: Secondary | ICD-10-CM

## 2018-08-04 DIAGNOSIS — G40109 Localization-related (focal) (partial) symptomatic epilepsy and epileptic syndromes with simple partial seizures, not intractable, without status epilepticus: Secondary | ICD-10-CM

## 2018-08-04 DIAGNOSIS — G801 Spastic diplegic cerebral palsy: Secondary | ICD-10-CM | POA: Diagnosis not present

## 2018-08-04 DIAGNOSIS — F902 Attention-deficit hyperactivity disorder, combined type: Secondary | ICD-10-CM

## 2018-08-04 DIAGNOSIS — I69359 Hemiplegia and hemiparesis following cerebral infarction affecting unspecified side: Secondary | ICD-10-CM | POA: Diagnosis not present

## 2018-08-04 DIAGNOSIS — G4733 Obstructive sleep apnea (adult) (pediatric): Secondary | ICD-10-CM

## 2018-08-04 DIAGNOSIS — F639 Impulse disorder, unspecified: Secondary | ICD-10-CM

## 2018-08-04 DIAGNOSIS — F5081 Binge eating disorder: Secondary | ICD-10-CM

## 2018-08-04 DIAGNOSIS — Q681 Congenital deformity of finger(s) and hand: Secondary | ICD-10-CM

## 2018-08-04 DIAGNOSIS — G43809 Other migraine, not intractable, without status migrainosus: Secondary | ICD-10-CM

## 2018-08-04 DIAGNOSIS — F3481 Disruptive mood dysregulation disorder: Secondary | ICD-10-CM

## 2018-08-04 DIAGNOSIS — M216X2 Other acquired deformities of left foot: Secondary | ICD-10-CM

## 2018-08-04 MED ORDER — LISDEXAMFETAMINE DIMESYLATE 60 MG PO CAPS: 60.0000 mg | ORAL_CAPSULE | ORAL | 0 refills | Status: DC

## 2018-08-04 NOTE — Progress Notes (Signed)
Virtual Visit via Video Note  I connected with Diane Barr on 08/04/18 at  4:30 PM EDT by a video enabled telemedicine application and verified that I am speaking with the correct person using two identifiers.   I discussed the limitations of evaluation and management by telemedicine and the availability of in person appointments. The patient expressed understanding and agreed to proceed.   History of Present Illness:See EPIC note    Observations/Objective:See EPIC note   Assessment and Plan:See EPIC note   Follow Up Instructions:See EPIC note      I discussed the assessment and treatment plan with the patient. The patient was provided an opportunity to ask questions and all were answered. The patient agreed with the plan and demonstrated an understanding of the instructions.   The patient was advised to call back or seek an in-person evaluation if the symptoms worsen or if the condition fails to improve as anticipated.  I provided 15 minutes of non-face-to-face time during this encounter.   Diane Barr, Diane Barr  [facility] MD/PA/NP OP Progress Note  08/04/2018 5:53 PM Diane Barr  MRN:  [id number]  Chief Complaint:  Chief Complaint    Follow-up; ADHD; Autism; Cerebral Palsy; Agitation; Anxiety     HPI:        At Last Visit 05/05/18: Diane Barr returns with her father today for scheduled 3 month FU and medication refills. Other than assigned work around the house she says she is doing "nothing" Dad mentions they are establishing Guardianship for Diane Barr and preparing to file for SSDI because of her CP orthopedic complications and her Autism/ADHD with agitation.Dad says she has occasional outbursts that are manageable.They are happy with current medications.  Today 08/04/18: WEBEX visit-Diane Barr sitting in [place] on beach chair on balcony overlooking pool.She is Programme researcher, broadcasting/film/video at [employer]. She again professes she is not doing anything and isnt planning on returning  to school.  Diane Barr has not beeen able to FU with Ortho;Neurology and Wgt clinics due to COVID crisis and her high risk status. Mom say she continues to have episodes of defiance but these are stable and mangeable,Mom does not feel she needs to be medicated at this time for this. They are staying in their room at the hotel     Visit Diagnosis:    ICD-10-CM   1. ADHD (attention deficit hyperactivity disorder), combined type F90.2   2. History of autism spectrum disorder F84.0   3. 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 Binge eating disorder F50.81   6. Impulse control disorder F63.9   7. 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 Obstructive sleep apnea syndrome G47.33   10. Other migraine without status migrainosus, not intractable G43.809   11. 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 DMDD (disruptive mood dysregulation disorder) (HCC) F34.81    Allergies:  Allergies  Allergen Reactions  . Peanuts [Peanut Oil] Anaphylaxis  . Other Other (See Comments)    Anti Psychotics induce seizures PLUMS, APPLES & GRAPES, WITH SEEDSNFoodmouth tingles Anti Psychotics induce seizures Anti Psychotics induce seizures PLUMS, APPLES & GRAPES, WITH SEEDSNFoodmouth tingles Anti Psychotics induce seizures    Metabolic Disorder Labs:  Metabolic Disorder Labs: Glucose 82 65 - 99 mg/dL  Lipid Panel  Component Value Ref Range Performed At  Cholesterol, Total 163 100 - 169 mg/dL [facility]  Triglycerides 85 0 - 89 mg/dL [facility]  HDL 42 >78>39 mg/dL [facility]  VLDL Cholesterol Cal 17 5 - 40 mg/dL [facility]  LDL Calculated 104 0 - 109 mg/dL [facility]      Lipid Panel  Narrative    Recent Labs  No results found for: TSH  TSH,Ultra Sensitive      TSH,Ultra Sensitive   Component Value Ref Range Performed At  TSH 3.656 0.40 - 5.50 uIU/ML [facility]   TSH,Ultra Sensitive  Specimen  Blood        TSH,Ultra Sensitive  Performing Organization Address City/State/Zipcode Phone Number  [facility]  Clinical Pathology Lab, CLIA# [id number], [address]  [place], [place] [id number]    [facility]  [facility], [facility] [address]  [place], [place] [id number]     Free Thyroxine (Free T4)      Free Thyroxine (Free T4)  Component Value Ref Range Performed At  FREE THYROXINE 0.9 0.6 - 1.75 NG/DL [facility]   Free Thyroxine (Free T4)  Specimen   Current Medications: Current Outpatient Medications  Medication Sig Dispense Refill  . BOTOX 100 units SOLR injection     . FLUCELVAX QUADRIVALENT 0.5 ML SUSY INJECT 0.5ML INTRAMUSCULARLY ONCE  0  . FLUoxetine (PROZAC) 20 MG capsule Take 1 capsule (20 mg total) by mouth daily. TAKE 1 CAPSULE DAILY (TAKE WITH 40 MG CAPSULE FOR TOTAL OF 60 MG DAILY) 90 capsule 3  . FLUoxetine (PROZAC) 40 MG capsule Take 1 capsule (40 mg total) by mouth daily. 90 capsule 3  . ketorolac (TORADOL) 10 MG tablet Take by mouth.    . lamoTRIgine (LAMICTAL) 150 MG tablet Take 1 tablet (150 mg total) by mouth 3 (three) times daily. 270 tablet 2  . lisdexamfetamine (VYVANSE) 60 MG capsule Take 1 capsule (60 mg total) by mouth every morning. 30 capsule 0  . lisdexamfetamine (VYVANSE) 60 MG capsule Take 1 capsule (60 mg total) by mouth every morning. DNFU 09/27/2018 30 capsule 0  . lisdexamfetamine (VYVANSE) 60 MG capsule Take 1 capsule (60 mg total) by mouth every morning. DNFU 08/28/18 30 capsule 0  . midazolam (VERSED) 10 MG/2ML SOLN injection Place 10 mg into the nose daily as needed for seizure. Reported on 07/03/2015  5  . naproxen sodium (ANAPROX) 550 MG tablet TAKE 1 TABLET WITH COMPAZINE FOR SEVERE HEADACHES MAY REPEAT IN 6 HOURS    . prochlorperazine (COMPAZINE) 10 MG  tablet Take 10 mg by mouth as needed for headache, seizure or headache. Take with Naprosuyn 550 mg    . prochlorperazine (COMPAZINE) 10 MG tablet Take 1 tablet with 1 naproxen 550 for severe headache.    . promethazine (PHENERGAN) 25 MG tablet Take by mouth.    . SUMAtriptan (IMITREX) 50 MG tablet Take 50 mg by mouth every 2 (two) hours as needed. May repeat in 2 hours if headache persists or recurs.    . topiramate (TOPAMAX) 100 MG tablet Take 1 tablet (100 mg total) by mouth 2 (two) times daily for 360 doses. 180 tablet 1   No current facility-administered medications for this visit.     Musculoskeletal: Strength & Muscle Tone: Telepsych visit-Grossly normal Musculoskeletal and cranial nerve inspections Gait & Station: NA Patient leans: N/A  Psychiatric Specialty Exam: Review of Systems  Constitutional: Negative for chills, diaphoresis, fever, malaise/fatigue and  weight loss.  Musculoskeletal: Positive for joint pain and myalgias. Negative for back pain, falls and neck pain.       CP related deformities-see visit diagnoses  Neurological: Positive for seizures (controlled with meds), loss of consciousness (CP related) and weakness (CP related). Negative for dizziness, tingling, tremors, sensory change, speech change, focal weakness and headaches.    There were no vitals taken for this visit.Telepsych visit  General Appearance: Casual  Eye Contact:  Good  Speech:  Clear and Coherent  Volume:  Normal  Mood:  Euthymic  Affect:  Congruent  Thought Process:  Coherent, Goal Directed and Descriptions of Associations: Intact  Orientation:  Full (Time, Place, and Person)  Thought Content: WDL   Suicidal Thoughts:  No  Homicidal Thoughts:  No  Memory:  Negative  Judgement:  Fair  Insight:  Lacking  Psychomotor Activity:  Telepsych visit limited view WNL  Concentration:  Concentration: Good and Attention Span: Good  Recall:  Negative  Fund of Knowledge: wdl  Language: WDL  Akathisia:   Telepsych visit limited view WNL  Handed:  Right  AIMS (if indicated): Telepsych visit limited view WNL  Assets:  Financial Resources/Insurance Housing Social Support Transportation  ADL's:  Impaired  Cognition: Impaired,  Moderate  Sleep:  No complaint     Assessment; Boneta remains at baseline   and Plan: Continue current medications. FU 3 months.Sooner if needed Continue COVID precautions   Diane Morn, PA-C 08/04/2018, 5:53 PM

## 2018-11-03 ENCOUNTER — Ambulatory Visit (INDEPENDENT_AMBULATORY_CARE_PROVIDER_SITE_OTHER): Payer: 59 | Admitting: Medical

## 2018-11-03 ENCOUNTER — Encounter (HOSPITAL_COMMUNITY): Payer: Self-pay | Admitting: Medical

## 2018-11-03 DIAGNOSIS — F840 Autistic disorder: Secondary | ICD-10-CM

## 2018-11-03 DIAGNOSIS — F902 Attention-deficit hyperactivity disorder, combined type: Secondary | ICD-10-CM | POA: Diagnosis not present

## 2018-11-03 DIAGNOSIS — G4733 Obstructive sleep apnea (adult) (pediatric): Secondary | ICD-10-CM

## 2018-11-03 DIAGNOSIS — F639 Impulse disorder, unspecified: Secondary | ICD-10-CM

## 2018-11-03 DIAGNOSIS — G40109 Localization-related (focal) (partial) symptomatic epilepsy and epileptic syndromes with simple partial seizures, not intractable, without status epilepticus: Secondary | ICD-10-CM | POA: Diagnosis not present

## 2018-11-03 DIAGNOSIS — M24522 Contracture, left elbow: Secondary | ICD-10-CM

## 2018-11-03 DIAGNOSIS — Q681 Congenital deformity of finger(s) and hand: Secondary | ICD-10-CM

## 2018-11-03 DIAGNOSIS — I69359 Hemiplegia and hemiparesis following cerebral infarction affecting unspecified side: Secondary | ICD-10-CM

## 2018-11-03 DIAGNOSIS — Z68.41 Body mass index (BMI) pediatric, greater than or equal to 95th percentile for age: Secondary | ICD-10-CM

## 2018-11-03 DIAGNOSIS — F5081 Binge eating disorder: Secondary | ICD-10-CM

## 2018-11-03 DIAGNOSIS — G801 Spastic diplegic cerebral palsy: Secondary | ICD-10-CM

## 2018-11-03 DIAGNOSIS — M216X2 Other acquired deformities of left foot: Secondary | ICD-10-CM

## 2018-11-03 MED ORDER — LISDEXAMFETAMINE DIMESYLATE 60 MG PO CAPS: 60.0000 mg | ORAL_CAPSULE | ORAL | 0 refills | Status: DC

## 2018-11-03 MED ORDER — LAMOTRIGINE 150 MG PO TABS: 150.0000 mg | ORAL_TABLET | Freq: Three times a day (TID) | ORAL | 2 refills | Status: DC

## 2018-11-03 MED ORDER — TOPIRAMATE 100 MG PO TABS: 100.0000 mg | ORAL_TABLET | Freq: Two times a day (BID) | ORAL | 1 refills | Status: DC

## 2018-11-03 MED ORDER — FLUOXETINE HCL 20 MG PO CAPS: 20.0000 mg | ORAL_CAPSULE | Freq: Every day | ORAL | 3 refills | Status: DC

## 2018-11-03 MED ORDER — FLUOXETINE HCL 40 MG PO CAPS: 40.0000 mg | ORAL_CAPSULE | Freq: Every day | ORAL | 3 refills | Status: DC

## 2018-11-03 NOTE — Progress Notes (Signed)
BH MD/PA/NP OP Progress Note  11/03/2018 2:18 PM Diane Barr  MRN:  [id number] Virtual Visit via Video Note  I connected with Diane Barr on 11/03/18 at  2:30 PM EDT by a video enabled telemedicine application and verified that I am speaking with the correct person using two identifiers.   I discussed the limitations of evaluation and management by telemedicine and the availability of in person appointments. The patient expressed understanding and agreed to proceed.  Hi History of Present Illness:See EPIC note    Observations/Objective:See EPIC note   Assessment and Plan:See EPIC note   Follow Up Instructions:See EPIC note   I discussed the assessment and treatment plan with the patient. The patient was provided an opportunity to ask questions and all were answered. The patient agreed with the plan and demonstrated an understanding of the instructions.   The patient was advised to call back or seek an in-person evaluation if the symptoms worsen or if the condition fails to improve as anticipated.  I provided 15 minutes of non-face-to-face time during this encounter.   Maryjean Morn, PA-C   Chief Complaint:  HPI: Diane Barr returns for 3 month FU and continues to be stable medically and psychiatrically.She has been followed virtually due to her high risk COVID status. She has no complaints. Her father is with her as well and voiced no concerns.  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 Take 1 mg folic acid daily. Topamax is a known teratogen (causes issues with the baby  if you become pregnant while on it). Therefore, it is recommended you use reliable birth control (oxcarbazepine can make birth control pills less effective) if you are sexually active.  -Continue oxcarbazepine 600 mg three times daily -Take 50 mg sumatriptan at headache onset. Can take 1 more pill if headache hasn't resolved in 2 hrs. No more than 2 pills in 24 hrs.  -Continue Lamotrigine 150 mg three times daily (managed by psychiatry) -Continue Topamax 50 mg twice daily -Continue to follow with psychiatry for behavioral concerns -Continue with Botox for spasticity as the pandemic allows -Labs at next in person visit (need to check sodium on TID dosing) -Follow up with Dr Doreatha Martin in 8 months  09/08/2018 Telephone Orthopaedics - [facility] [place]  [address]  [place], [place] [address]  [phone]  Elinor Parkinson, [address]  [place], [place] [address]  [phone]   Telephone Encounter - Elinor Parkinson, RN - 09/08/2018 2:48 PM EDT Incoming call received. Patient identifiers x2 used. Spoke with patient's mother regarding appointment. Mother states that she does not want to bring patient out right now due to Covid-19. Appointment rescheduled to 02/15/2019. Encouraged to call back with any additional questions or concerns. Mother verbally understands and states no further needs at this time.   Past Medical History:  Diagnosis Date  . Acquired equinus deformity of left foot    CP related  . Acquired pes planovalgus of left foot    CP related  . ADHD (attention deficit hyperactivity disorder)   . Anxiety   . CP (cerebral palsy), spastic (HCC) birth   CVA related  .  CVA (cerebrovascular accident due to intracerebral hemorrhage) (HCC) at birth  . CVA, old, hemiparesis (HCC) birth   CP  . Depression   . Epilepsy, localization-related (HCC)    with complex partial seizures,without mention of intractable epilepsy  . Flexion deformity    Lt upper extremity   . 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 multiple repairs on arm and leg     Allergies:  Allergies  Allergen Reactions  . Peanuts [Peanut Oil] Anaphylaxis  . Other Other (See Comments)    Anti Psychotics induce seizures PLUMS, APPLES & GRAPES, WITH SEEDSNFoodmouth tingles Anti Psychotics induce seizures Anti Psychotics induce seizures PLUMS, APPLES & GRAPES, WITH SEEDSNFoodmouth tingles Anti Psychotics induce seizures    Metabolic Disorder Labs: Glucose 82 65 - 99 mg/dL        Lipid Panel  Component Value Ref Range Performed At  Cholesterol, Total 163 100 - 169 mg/dL [facility]  Triglycerides 85 0 - 89 mg/dL [facility]  HDL 42 >16>39 mg/dL [facility]  VLDL Cholesterol Cal 17 5 - 40 mg/dL [facility]  LDL Calculated 104 0 - 109 mg/dL [facility]         TSH,Ultra Sensitive  Component Value Ref Range Performed At  TSH 3.656 0.40 - 5.50 uIU/ML SUNQUEST    Free Thyroxine (Free T4)      Free Thyroxine (Free T4)  Component Value Ref Range Performed At  FREE THYROXINE 0.9 0.6 - 1.75 NG/DL SUNQUEST     Current Medications: Current Outpatient Medications  Medication Sig Dispense Refill  . BOTOX 100 units SOLR injection     . FLUCELVAX QUADRIVALENT 0.5 ML SUSY INJECT 0.5ML INTRAMUSCULARLY ONCE  0  . FLUoxetine (PROZAC) 20 MG capsule Take 1 capsule (20 mg total) by mouth daily. TAKE 1 CAPSULE DAILY (TAKE WITH 40 MG CAPSULE FOR TOTAL OF 60 MG DAILY) 90 capsule 3  . FLUoxetine (PROZAC) 40 MG capsule Take 1 capsule (40 mg total) by mouth daily. 90 capsule 3  . ketorolac (TORADOL) 10 MG tablet Take by mouth.    . lamoTRIgine (LAMICTAL) 150 MG tablet Take 1 tablet (150 mg total) by mouth 3 (three) times daily. 270 tablet 2  . lisdexamfetamine (VYVANSE) 60 MG capsule Take 1 capsule (60 mg total) by mouth every morning. 30 capsule 0  . lisdexamfetamine (VYVANSE) 60 MG  capsule Take 1 capsule (60 mg total) by mouth every morning. DNFU 09/27/2018 30 capsule 0  . lisdexamfetamine (VYVANSE) 60 MG capsule Take 1 capsule (60 mg total) by mouth every morning. DNFU 08/28/18 30 capsule 0  . midazolam (VERSED) 10 MG/2ML SOLN injection Place 10 mg into the nose daily as needed for seizure. Reported on 07/03/2015  5  . naproxen sodium (ANAPROX) 550 MG tablet TAKE 1 TABLET WITH COMPAZINE FOR SEVERE HEADACHES MAY REPEAT IN 6 HOURS    . prochlorperazine (COMPAZINE) 10 MG tablet Take 10 mg by mouth as needed for headache, seizure or headache. Take with Naprosuyn 550 mg    . prochlorperazine (COMPAZINE) 10 MG tablet Take 1 tablet with 1 naproxen 550 for severe headache.    . promethazine (PHENERGAN) 25 MG tablet Take by mouth.    . SUMAtriptan (IMITREX) 50 MG tablet Take 50 mg by mouth every 2 (two) hours as needed. May repeat in 2 hours if headache persists or recurs.    . topiramate (TOPAMAX) 100  MG tablet Take 1 tablet (100 mg total) by mouth 2 (two) times daily for 360 doses. 180 tablet 1   No current facility-administered medications for this visit.     Musculoskeletal: Strength & Muscle Tone: Telepsych visit- Gait & Station: NA Patient leans: N/A  Psychiatric Specialty Exam: Review of Systems  Constitutional: Negative for chills, diaphoresis, fever, malaise/fatigue and weight loss.  Musculoskeletal: Positive for myalgias.  Neurological: Positive for seizures. Negative for dizziness, tingling, tremors, sensory change, speech change, focal weakness, loss of consciousness, weakness and headaches.  Endo/Heme/Allergies: Positive for environmental allergies.  Psychiatric/Behavioral: Positive for depression. Negative for hallucinations, memory loss, substance abuse and suicidal ideas. The patient is nervous/anxious and has insomnia.     There were no vitals taken for this visit.Telepsych visit  General Appearance: Neat and Well Groomed  Eye Contact:  Good   Speech:  Clear and Coherent and Normal Rate  Volume:  Normal  Mood:  Euthymic  Affect:  Appropriate and Congruent  Thought Process:  Coherent and Descriptions of Associations: Intact  Orientation:  Full (Time, Place, and Person)  Thought Content: WDL   Suicidal Thoughts:  No  Homicidal Thoughts:  No   Memory:  Negative  Judgement:  Other:  WDL/impulsive at times  Insight:  Limited/WDL  Psychomotor Activity:  WDL  Concentration:  Concentration: Good and Attention Span: Good  Recall:  Negative  Fund of Knowledge: WDL  Language: WDL  Akathisia:  NA  Handed:  Right  AIMS (if indicated): Telepsych visit limited view WNL  Assets:  Financial Resources/Insurance Housing Leisure Time Resilience Social Support Talents/Skills Transportation Vocational/Educational  ADL's:  Impaired  Cognition: Impaired,  Severe  Sleep:  No complaint    Assessment  : Complicated patient stable at this time.High risk COVID  and Plan; No change in medications FU 3 months-sooner if needed.Continue COVID protections  Darlyne Russian, PA-C 11/03/2018, 2:18 PM

## 2019-02-08 ENCOUNTER — Telehealth (HOSPITAL_COMMUNITY): Payer: Self-pay | Admitting: Medical

## 2019-02-08 NOTE — Telephone Encounter (Signed)
Pt has an apt tomorrow. However mom called and asked for her vyvanse to be filled today because if she does not take it in the morning she will get a headache.   [facility]  cb [phone]

## 2019-02-09 ENCOUNTER — Ambulatory Visit (INDEPENDENT_AMBULATORY_CARE_PROVIDER_SITE_OTHER): Payer: 59 | Admitting: Medical

## 2019-02-09 DIAGNOSIS — F902 Attention-deficit hyperactivity disorder, combined type: Secondary | ICD-10-CM | POA: Diagnosis not present

## 2019-02-09 DIAGNOSIS — I69359 Hemiplegia and hemiparesis following cerebral infarction affecting unspecified side: Secondary | ICD-10-CM | POA: Diagnosis not present

## 2019-02-09 DIAGNOSIS — G43809 Other migraine, not intractable, without status migrainosus: Secondary | ICD-10-CM

## 2019-02-09 DIAGNOSIS — F5081 Binge eating disorder: Secondary | ICD-10-CM

## 2019-02-09 DIAGNOSIS — Z68.41 Body mass index (BMI) pediatric, greater than or equal to 95th percentile for age: Secondary | ICD-10-CM

## 2019-02-09 DIAGNOSIS — G801 Spastic diplegic cerebral palsy: Secondary | ICD-10-CM

## 2019-02-09 DIAGNOSIS — M216X2 Other acquired deformities of left foot: Secondary | ICD-10-CM

## 2019-02-09 DIAGNOSIS — Q681 Congenital deformity of finger(s) and hand: Secondary | ICD-10-CM

## 2019-02-09 DIAGNOSIS — F639 Impulse disorder, unspecified: Secondary | ICD-10-CM

## 2019-02-09 DIAGNOSIS — G40109 Localization-related (focal) (partial) symptomatic epilepsy and epileptic syndromes with simple partial seizures, not intractable, without status epilepticus: Secondary | ICD-10-CM

## 2019-02-09 DIAGNOSIS — F840 Autistic disorder: Secondary | ICD-10-CM

## 2019-02-09 DIAGNOSIS — M24522 Contracture, left elbow: Secondary | ICD-10-CM

## 2019-02-09 DIAGNOSIS — G4733 Obstructive sleep apnea (adult) (pediatric): Secondary | ICD-10-CM

## 2019-02-09 DIAGNOSIS — F3481 Disruptive mood dysregulation disorder: Secondary | ICD-10-CM

## 2019-02-09 MED ORDER — TOPIRAMATE 100 MG PO TABS: 100.0000 mg | ORAL_TABLET | Freq: Two times a day (BID) | ORAL | 1 refills | Status: DC

## 2019-02-09 MED ORDER — LISDEXAMFETAMINE DIMESYLATE 60 MG PO CAPS: 60.0000 mg | ORAL_CAPSULE | ORAL | 0 refills | Status: DC

## 2019-02-09 MED ORDER — LAMOTRIGINE 150 MG PO TABS: 150.0000 mg | ORAL_TABLET | Freq: Three times a day (TID) | ORAL | 2 refills | Status: DC

## 2019-02-09 MED ORDER — FLUOXETINE HCL 20 MG PO CAPS: 20.0000 mg | ORAL_CAPSULE | Freq: Every day | ORAL | 3 refills | Status: DC

## 2019-02-09 MED ORDER — FLUOXETINE HCL 40 MG PO CAPS: 40.0000 mg | ORAL_CAPSULE | Freq: Every day | ORAL | 3 refills | Status: DC

## 2019-02-09 NOTE — Progress Notes (Signed)
Virtual Visit via Video Note  I connected with Diane Barr on 02/09/19 at 11:15 AM EST by a video enabled telemedicine application and verified that I am speaking with the correct person using two identifiers.   I discussed the limitations of evaluation and management by telemedicine and the availability of in person appointments. The patient expressed understanding and agreed to proceed.  History of Present Illness:See EPIC note    Observations/Objective:See EPIC note   Assessment and Plan:See EPIC note   Follow Up Instructions:See EPIC note    I discussed the assessment and treatment plan with the patient. The patient was provided an opportunity to ask questions and all were answered. The patient agreed with the plan and demonstrated an understanding of the instructions.   The patient was advised to call back or seek an in-person evaluation if the symptoms worsen or if the condition fails to improve as anticipated.  I provided 25 minutes of non-face-to-face time during this encounter.   Maryjean Morn, Cordelia Poche  [facility] MD/PA/NP OP Progress Note  02/09/2019 5:31 PM Diane Barr  MRN:  [id number]  Chief Complaint:  Chief Complaint    Follow-up; ADHD; Autism; Cerebral Palsy; Cerebrovascular Accident; Medication Refill     HPI: Diane Barr is seen for scheduled 3 month FU. She is helping around the house and doing decorating for Christmas. They did have a COVID scare but [name] tested negative. Mom says episodes of acting out are stable. Mom wants to know if Diane Barr would need to "taper off" her Vyvanse.(Stacies face shows displeasure at this inquiry).  Visit Diagnosis:    ICD-10-CM   1. History of autism spectrum disorder  F84.0   2. ADHD (attention deficit hyperactivity disorder), combined type  F90.2   3. CP (cerebral palsy), spastic (HCC)  G80.1   4. CVA, old, hemiparesis (HCC)  I69.359   5. Partial epilepsy (HCC)  G40.109   6. Acquired equinus deformity of left  foot  M21.6X2   7. 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 Binge eating disorder  F50.81   10. Impulse control disorder  F63.9   11. Obstructive sleep apnea syndrome  G47.33   12. BMI (body mass index), pediatric, greater than 99% for age  Z91.54   49. Other migraine without status migrainosus, not intractable  G43.809   14. DMDD (disruptive mood dysregulation disorder) [facility])  F34.81    Past Medical History:   01/25/2019 Telemedicine [facility]  [address], [address]  [place], [place] [address]  [phone]  Zachery Dakins, NP  [address] [place], [place] [address]  [phone]  [phone] (Fax)  Pruritic dermatitis (Primary Dx)   Past Medical History:  Diagnosis Date  . Acquired equinus deformity of left foot    CP related  . Acquired pes planovalgus of left foot    CP related  . ADHD (attention deficit hyperactivity disorder)   . Anxiety   . CP (cerebral palsy), spastic (HCC) birth   CVA related  . CVA (cerebrovascular accident due to intracerebral hemorrhage) (HCC) at birth  . CVA, old, hemiparesis (HCC) birth   CP  . Depression   . Epilepsy, localization-related (HCC)    with complex partial seizures,without mention of intractable epilepsy  . Flexion deformity    Lt upper extremity  . 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 CVA with CP  Past Surgical History:  Procedure Laterality Date  . multiple repairs on arm and leg      Family History:  Family History  Problem Relation Age of Onset  . ADD / ADHD Father   . ADD / ADHD Sister     Social History:  Social History   Socioeconomic History  . Marital status: Single    Spouse name: Not on file  . Number of children: Not on file  . Years of education: Not on file  . Highest education level: Not on file  Occupational History  . Not on file  Social Needs  . Financial resource strain:  Not on file  . Food insecurity    Worry: Not on file    Inability: Not on file  . Transportation needs    Medical: Not on file    Non-medical: Not on file  Tobacco Use  . Smoking status: Never Smoker  . Smokeless tobacco: Never Used  Substance and Sexual Activity  . Alcohol use: No  . Drug use: No  . Sexual activity: Never  Lifestyle  . Physical activity    Days per week: Not on file    Minutes per session: Not on file  . Stress: Not on file  Relationships  . Social Herbalist on phone: Not on file    Gets together: Not on file    Attends religious service: Not on file    Active member of club or organization: Not on file    Attends meetings of clubs or organizations: Not on file    Relationship status: Not on file  Other Topics Concern  . Not on file  Social History Narrative  . Not on file    Allergies:  Allergies  Allergen Reactions  . Peanuts [Peanut Oil] Anaphylaxis  . Other Other (See Comments)    Anti Psychotics induce seizures PLUMS, APPLES & GRAPES, WITH SEEDSNFoodmouth tingles Anti Psychotics induce seizures Anti Psychotics induce seizures PLUMS, APPLES & GRAPES, WITH SEEDSNFoodmouth tingles Anti Psychotics induce seizures    Metabolic Disorder Labs: Glucose 82 65 - 99 mg/dL        Lipid Panel  Component Value Ref Range Performed At  Cholesterol, Total 163 100 - 169 mg/dL [facility]  Triglycerides 85 0 - 89 mg/dL [facility]  HDL 42 >39 mg/dL [facility]  VLDL Cholesterol Cal 17 5 - 40 mg/dL [facility]  LDL Calculated 104 0 - 109 mg/dL [facility]         TSH,Ultra Sensitive  Component Value Ref Range Performed At  TSH 3.656 0.40 - 5.50 uIU/ML SUNQUEST    Free Thyroxine (Free T4)      Free Thyroxine (Free T4)  Component Value Ref Range Performed At  FREE THYROXINE 0.9 0.6 - 1.75 NG/DL [facility]    Therapeutic Level Labs:na  Current Medications:  Current Outpatient Medications  Medication Sig Dispense Refill  .  triamcinolone cream (KENALOG) 0.1 % Apply topically.    Marland Kitchen BOTOX 100 units SOLR injection     . FLUCELVAX QUADRIVALENT 0.5 ML SUSY INJECT 0.5ML INTRAMUSCULARLY ONCE  0  . FLUoxetine (PROZAC) 20 MG capsule Take 1 capsule (20 mg total) by mouth daily. TAKE 1 CAPSULE DAILY (TAKE WITH 40 MG CAPSULE FOR TOTAL OF 60 MG DAILY) 90 capsule 3  . FLUoxetine (PROZAC) 40 MG capsule Take 1 capsule (40 mg total) by mouth daily. 90 capsule 3  . ketorolac (TORADOL) 10 MG tablet Take by mouth.    Marland Kitchen  lamoTRIgine (LAMICTAL) 150 MG tablet Take 1 tablet (150 mg total) by mouth 3 (three) times daily. 270 tablet 2  . lisdexamfetamine (VYVANSE) 60 MG capsule Take 1 capsule (60 mg total) by mouth every morning. 30 capsule 0  . lisdexamfetamine (VYVANSE) 60 MG capsule Take 1 capsule (60 mg total) by mouth every morning. DNFbefore 03/08/19 30 capsule 0  . lisdexamfetamine (VYVANSE) 60 MG capsule Take 1 capsule (60 mg total) by mouth every morning. DNFbefore 04/05/19 30 capsule 0  . midazolam (VERSED) 10 MG/2ML SOLN injection Place 10 mg into the nose daily as needed for seizure. Reported on 07/03/2015  5  . naproxen sodium (ANAPROX) 550 MG tablet TAKE 1 TABLET WITH COMPAZINE FOR SEVERE HEADACHES MAY REPEAT IN 6 HOURS    . Oxcarbazepine (TRILEPTAL) 300 MG tablet Take by mouth.    . prochlorperazine (COMPAZINE) 10 MG tablet Take 10 mg by mouth as needed for headache, seizure or headache. Take with Naprosuyn 550 mg    . prochlorperazine (COMPAZINE) 10 MG tablet Take 1 tablet with 1 naproxen 550 for severe headache.    . promethazine (PHENERGAN) 25 MG tablet Take by mouth.    . SUMAtriptan (IMITREX) 50 MG tablet Take by mouth.    . topiramate (TOPAMAX) 100 MG tablet Take 1 tablet (100 mg total) by mouth 2 (two) times daily for 360 doses. 180 tablet 1   No current facility-administered medications for this visit.      Musculoskeletal: Strength & Muscle Tone: Telepsych visit-Grossly normal Musculoskeletal and cranial nerve  inspections Gait & Station: NA Patient leans: N/A Psychiatric Specialty Exam: Review of Systems  Constitutional: Negative for chills, diaphoresis, fever, malaise/fatigue and weight loss.       Obesity  Respiratory: Negative for cough, hemoptysis, sputum production, shortness of breath and wheezing.        Negative COVID test November  Gastrointestinal: Negative for abdominal pain, blood in stool, constipation, diarrhea, heartburn, melena, nausea and vomiting.  Musculoskeletal: Positive for joint pain and myalgias.       Contractures from CVA  Neurological: Positive for focal weakness, seizures and headaches.       CP/CVA with sequellae  Psychiatric/Behavioral: Negative for depression, hallucinations, substance abuse and suicidal ideas. The patient is nervous/anxious. The patient does not have insomnia.        Autistic/ADHD /DMDD-occasional outbursts    There were no vitals taken for this visit.There is no height or weight on file to calculate BMI.WEBEX visit  General Appearance: Casual and obese  Eye Contact:  Good  Speech:  Clear and Coherent  Volume:  Normal  Mood:  Diane ShelterVariable-she is happy/euthymic until mom asks about stopping Vyvanse  Affect:  Congruent  Thought Process:  Coherent and Descriptions of Associations: Intact  Orientation:  Full (Time, Place, and Person)  Thought Content: WDL   Suicidal Thoughts:  No  Homicidal Thoughts:  No  Memory:  Negative  Judgement:  Other:  WDL  Insight:  WDL  Psychomotor Activity:  Limited view no change   Concentration:  Concentration: Good and Attention Span: Good  Recall:  WDL  Fund of Knowledge: WDL  Language: WDL  Akathisia:  Negative  Handed:  Right  AIMS (if indicated): NA  Assets:  Financial Resources/Insurance Housing Resilience Social Support Talents/Skills Transportation Vocational/Educational  ADL's:  Intact  Cognition: WNL  Sleep:  Negative   Screenings:  Care Everywhere Records reviewed  POCT CoVid-19 nucleic  acid (01/08/2019 11:09 AM EST) POCT CoVid-19 nucleic acid (01/08/2019 11:09 AM EST)  Component Value Ref Range Performed At Pathologist Signature  SARS-COV-2, NAA Negative Negative     POCT CoVid-19 nucleic acid (01/08/2019 11:09 AM EST)  Specimen     Assessment Yuleidy is at baseline. Mom is wondering if she needs her stimulant medication.   and Plan: Medications were refilled.  Over 1/2 visit was spent discussing medication Vyvanse and Mother's inquiries.Rather than try to change anything this month  It was agreed to wait til after Christmas/New Year and try a trial of stopping Vyvanse. Schedule FU for 3 months-sooner if needed   Maryjean Morn, PA-C 02/09/2019, 5:31 PM

## 2019-02-10 NOTE — Telephone Encounter (Signed)
rx filled at apt

## 2019-02-18 ENCOUNTER — Encounter (HOSPITAL_COMMUNITY): Payer: Self-pay | Admitting: Medical

## 2019-05-04 ENCOUNTER — Other Ambulatory Visit: Payer: Self-pay

## 2019-05-04 ENCOUNTER — Ambulatory Visit (INDEPENDENT_AMBULATORY_CARE_PROVIDER_SITE_OTHER): Payer: 59 | Admitting: Medical

## 2019-05-04 ENCOUNTER — Encounter (HOSPITAL_COMMUNITY): Payer: Self-pay | Admitting: Medical

## 2019-05-04 DIAGNOSIS — G40109 Localization-related (focal) (partial) symptomatic epilepsy and epileptic syndromes with simple partial seizures, not intractable, without status epilepticus: Secondary | ICD-10-CM

## 2019-05-04 DIAGNOSIS — G4733 Obstructive sleep apnea (adult) (pediatric): Secondary | ICD-10-CM

## 2019-05-04 DIAGNOSIS — F902 Attention-deficit hyperactivity disorder, combined type: Secondary | ICD-10-CM | POA: Diagnosis not present

## 2019-05-04 DIAGNOSIS — M216X2 Other acquired deformities of left foot: Secondary | ICD-10-CM

## 2019-05-04 DIAGNOSIS — F3481 Disruptive mood dysregulation disorder: Secondary | ICD-10-CM

## 2019-05-04 DIAGNOSIS — I69359 Hemiplegia and hemiparesis following cerebral infarction affecting unspecified side: Secondary | ICD-10-CM

## 2019-05-04 DIAGNOSIS — F639 Impulse disorder, unspecified: Secondary | ICD-10-CM

## 2019-05-04 DIAGNOSIS — F411 Generalized anxiety disorder: Secondary | ICD-10-CM

## 2019-05-04 DIAGNOSIS — F840 Autistic disorder: Secondary | ICD-10-CM | POA: Diagnosis not present

## 2019-05-04 DIAGNOSIS — Z68.41 Body mass index (BMI) pediatric, greater than or equal to 95th percentile for age: Secondary | ICD-10-CM

## 2019-05-04 DIAGNOSIS — Q681 Congenital deformity of finger(s) and hand: Secondary | ICD-10-CM

## 2019-05-04 DIAGNOSIS — G801 Spastic diplegic cerebral palsy: Secondary | ICD-10-CM | POA: Diagnosis not present

## 2019-05-04 DIAGNOSIS — G43809 Other migraine, not intractable, without status migrainosus: Secondary | ICD-10-CM

## 2019-05-04 DIAGNOSIS — M24522 Contracture, left elbow: Secondary | ICD-10-CM

## 2019-05-04 DIAGNOSIS — F5081 Binge eating disorder: Secondary | ICD-10-CM

## 2019-05-04 MED ORDER — LAMOTRIGINE 150 MG PO TABS: 150.0000 mg | ORAL_TABLET | Freq: Three times a day (TID) | ORAL | 2 refills | Status: DC

## 2019-05-04 MED ORDER — FLUOXETINE HCL 40 MG PO CAPS: 40.0000 mg | ORAL_CAPSULE | Freq: Every day | ORAL | 3 refills | Status: DC

## 2019-05-04 MED ORDER — LISDEXAMFETAMINE DIMESYLATE 60 MG PO CAPS: 60.0000 mg | ORAL_CAPSULE | ORAL | 0 refills | Status: DC

## 2019-05-04 MED ORDER — TOPIRAMATE 100 MG PO TABS: 100.0000 mg | ORAL_TABLET | Freq: Two times a day (BID) | ORAL | 1 refills | Status: DC

## 2019-05-04 MED ORDER — FLUOXETINE HCL 20 MG PO CAPS: 20.0000 mg | ORAL_CAPSULE | Freq: Every day | ORAL | 3 refills | Status: DC

## 2019-05-04 NOTE — Progress Notes (Signed)
BH MD/PA/NP OP Progress Note  05/04/2019 1:24 PM Diane Barr  MRN:  [id number] Virtual Visit via Video Note  I connected with Diane Barr on 05/04/19 at 11:00 AM EST by a video enabled telemedicine application and verified that I am speaking with the correct person using two identifiers.   I discussed the limitations of evaluation and management by telemedicine and the availability of in person appointments. The patient expressed understanding and agreed to proceed.   History of Present Illness:See EPIC note    Observations/Objective:See EPIC note   Assessment and Plan:See EPIC note   Follow Up Instructions:See EPIC note   I discussed the assessment and treatment plan with the patient. The patient was provided an opportunity to ask questions and all were answered. The patient agreed with the plan and demonstrated an understanding of the instructions.   The patient was advised to call back or seek an in-person evaluation if the symptoms worsen or if the condition fails to improve as anticipated.  I provided 15 minutes of non-face-to-face time during this encounter.   Diane Morn, PA-C   Chief Complaint:  Chief Complaint    Follow-up; Autism; ADHD; Cerebral Palsy; Cerebrovascular Accident; Medication Refill     HPI: Trust is seen for Routine 3 month FU and Medication management/refills. At her last visit 02/09/19 she was seen with Mom who was asking about Diane Barr tapering off her Vyvanse which Diane Barr was not happy about. Today, her father is present and there are no questions about her medications. She is stable staying at home with family to avoid COVID-helping around house and caring for her niece. She says she has exciting news-she is going to be an Aunt again needs refill.. Review of Care Everywhere reveals one visit 02/15/19.  to Orthopedics for Botox injections to spastic muscles for hemiplegia.  Visit Diagnosis:    ICD-10-CM   1. History of autism  spectrum disorder  F84.0   2. ADHD (attention deficit hyperactivity disorder), combined type  F90.2   3. CVA, old, hemiparesis (HCC)  I69.359   4. CP (cerebral palsy), spastic (HCC)  G80.1   5. DMDD (disruptive mood dysregulation disorder) (HCC)  F34.81   6. Partial epilepsy (HCC)  G40.109   7. Acquired equinus deformity of left foot  M21.6X2   8. Contracture of left elbow  M24.522   9. 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 Impulse control disorder  F63.9   11. Binge eating disorder  F50.81   12. Obstructive sleep apnea syndrome  G47.33   13. BMI (body mass index), pediatric, greater than 99% for age  Z23.54   41. Other migraine without status migrainosus, not intractable  G43.809   15. GAD (generalized anxiety disorder)  F41.1     Allergies:  Allergies  Allergen Reactions  . Peanuts [Peanut Oil] Anaphylaxis  . Other Other (See Comments)    Anti Psychotics induce seizures PLUMS, APPLES & GRAPES, WITH SEEDSNFoodmouth tingles Anti Psychotics induce seizures Anti Psychotics induce seizures PLUMS, APPLES & GRAPES, WITH SEEDSNFoodmouth tingles Anti Psychotics induce seizures   Glucose 82 65 - 99 mg/dL        Lipid Panel  Component Value Ref Range Performed At  Cholesterol, Total 163 100 - 169 mg/dL [facility]  Triglycerides 85 0 - 89 mg/dL [facility]  HDL 42 >78 mg/dL [facility]  VLDL Cholesterol Cal 17 5 - 40 mg/dL [facility]  LDL Calculated 104 0 - 109 mg/dL [facility]  TSH,Ultra Sensitive  Component Value Ref Range Performed At  TSH 3.656 0.40 - 5.50 uIU/ML SUNQUEST    Free Thyroxine (Free T4)      Free Thyroxine (Free T4)  Component Value Ref Range Performed At  FREE THYROXINE 0.9 0.6 - 1.75 NG/DL SUNQUEST    Therapeutic Level Labs:na  Current Medications: Current Outpatient Medications  Medication Sig Dispense Refill  . midazolam (VERSED) 5 MG/ML injection SPRAY 1 ML IN EACH NOSTRIL AS NEEDED FOR SEIZURES LASTING GREATER THAN 5 MINUTES     . BOTOX 100 units SOLR injection     . FLUCELVAX QUADRIVALENT 0.5 ML SUSY INJECT 0.5ML INTRAMUSCULARLY ONCE  0  . FLUoxetine (PROZAC) 20 MG capsule Take 1 capsule (20 mg total) by mouth daily. TAKE 1 CAPSULE DAILY (TAKE WITH 40 MG CAPSULE FOR TOTAL OF 60 MG DAILY) 90 capsule 3  . FLUoxetine (PROZAC) 40 MG capsule Take 1 capsule (40 mg total) by mouth daily. 90 capsule 3  . ketorolac (TORADOL) 10 MG tablet Take by mouth.    . lamoTRIgine (LAMICTAL) 150 MG tablet Take 1 tablet (150 mg total) by mouth 3 (three) times daily. 270 tablet 2  . lisdexamfetamine (VYVANSE) 60 MG capsule Take 1 capsule (60 mg total) by mouth every morning. DNF Before 06/27/2019 30 capsule 0  . lisdexamfetamine (VYVANSE) 60 MG capsule Take 1 capsule (60 mg total) by mouth every morning. DNFBefore 05/29/2019 30 capsule 0  . lisdexamfetamine (VYVANSE) 60 MG capsule Take 1 capsule (60 mg total) by mouth every morning. 30 capsule 0  . naproxen sodium (ANAPROX) 550 MG tablet TAKE 1 TABLET WITH COMPAZINE FOR SEVERE HEADACHES MAY REPEAT IN 6 HOURS    . Oxcarbazepine (TRILEPTAL) 300 MG tablet Take by mouth.    . prochlorperazine (COMPAZINE) 10 MG tablet Take 10 mg by mouth as needed for headache, seizure or headache. Take with Naprosuyn 550 mg    . prochlorperazine (COMPAZINE) 10 MG tablet Take 1 tablet with 1 naproxen 550 for severe headache.    . promethazine (PHENERGAN) 25 MG tablet Take by mouth.    . SUMAtriptan (IMITREX) 50 MG tablet Take by mouth.    . topiramate (TOPAMAX) 100 MG tablet Take 1 tablet (100 mg total) by mouth 2 (two) times daily for 360 doses. 180 tablet 1  . triamcinolone cream (KENALOG) 0.1 % Apply topically.     No current facility-administered medications for this visit.    Musculoskeletal: Strength & Muscle Tone: Telepsych visit-Grossly normal Musculoskeletal and cranial nerve inspections Gait & Station: NA Patient leans: N/A  Psychiatric Specialty Exam: Review of Systems  Constitutional: Negative  for activity change, appetite change, chills, diaphoresis, fatigue, fever and unexpected weight change.  Musculoskeletal: Positive for arthralgias and gait problem. Negative for back pain, joint swelling, myalgias, neck pain and neck stiffness.  Skin: Negative for color change and pallor.  Neurological: Positive for seizures. Negative for dizziness, tremors, syncope, facial asymmetry, speech difficulty, weakness, light-headedness, numbness and headaches.  Hematological: Negative for adenopathy. Does not bruise/bleed easily.  Psychiatric/Behavioral: Positive for behavioral problems (no complaint this visit), decreased concentration (ADHD), dysphoric mood and sleep disturbance (has medication). Negative for agitation, confusion, hallucinations, self-injury and suicidal ideas. The patient is not nervous/anxious and is not hyperactive.     There were no vitals taken for this visit.There is no height or weight on file to calculate BMI.[place] VISIT  General Appearance: Casual Alert  Eye Contact:  Good  Speech:  Clear and Coherent  Volume:  Normal  Mood:  Euthymic  Affect:  Congruent  Thought Process:  Coherent and Descriptions of Associations: Intact  Orientation:  Full (Time, Place, and Person)  Thought Content: WDL   Suicidal Thoughts:  No  Homicidal Thoughts:  No  Memory:  Negative  Judgement:  Fair  Insight:  Fair  Psychomotor Activity:  Spastic hemiplagia  Concentration:  Concentration: Good and Attention Span: Good  Recall:  Negative  Fund of Knowledge: WDL  Language: WDL  Akathisia:  NA  Handed:  Right  AIMS (if indicated): NA  Assets:   Financial Resources/Insurance Housing Resilience Social Support Transportation  ADL's:  Impaired  Cognition: Impaired,  Moderate  Sleep:  no complaint     Assessment;Stable with current meds. No recent episodes of agitation    Plan: 3 month refill of medications . 3 month FU-sooner if needed   Diane Morn, PA-C 05/04/2019, 1:24 PM

## 2019-07-03 ENCOUNTER — Telehealth (HOSPITAL_COMMUNITY): Payer: Self-pay

## 2019-07-03 NOTE — Telephone Encounter (Signed)
[employer] PRESCRIPTION COVERAGE WAS APPROVED 06/07/2019  EFFECTIVE UNTIL 06/06/2020 VYVANSE 60MG  CAPSULE PA# 

## 2019-08-08 ENCOUNTER — Telehealth (HOSPITAL_COMMUNITY): Payer: Self-pay

## 2019-08-08 NOTE — Telephone Encounter (Signed)
Parents have contacted pharmacy for a refill on Vyvanse. Patient is now completely out. Can we get this refilled today. Patient has appointment on this Thursday. [facility] [address]

## 2019-08-09 ENCOUNTER — Other Ambulatory Visit (HOSPITAL_COMMUNITY): Payer: Self-pay | Admitting: Medical

## 2019-08-09 MED ORDER — LISDEXAMFETAMINE DIMESYLATE 60 MG PO CAPS: 60.0000 mg | ORAL_CAPSULE | ORAL | 0 refills | Status: DC

## 2019-08-09 NOTE — Telephone Encounter (Signed)
Rx sent 

## 2019-08-10 ENCOUNTER — Encounter (HOSPITAL_COMMUNITY): Payer: Self-pay | Admitting: Medical

## 2019-08-10 ENCOUNTER — Telehealth (HOSPITAL_COMMUNITY): Payer: 59 | Admitting: Medical

## 2019-08-10 ENCOUNTER — Telehealth (INDEPENDENT_AMBULATORY_CARE_PROVIDER_SITE_OTHER): Payer: 59 | Admitting: Medical

## 2019-08-10 DIAGNOSIS — F840 Autistic disorder: Secondary | ICD-10-CM | POA: Diagnosis not present

## 2019-08-10 DIAGNOSIS — G40109 Localization-related (focal) (partial) symptomatic epilepsy and epileptic syndromes with simple partial seizures, not intractable, without status epilepticus: Secondary | ICD-10-CM

## 2019-08-10 DIAGNOSIS — G43809 Other migraine, not intractable, without status migrainosus: Secondary | ICD-10-CM

## 2019-08-10 DIAGNOSIS — Z68.41 Body mass index (BMI) pediatric, greater than or equal to 95th percentile for age: Secondary | ICD-10-CM

## 2019-08-10 DIAGNOSIS — G4733 Obstructive sleep apnea (adult) (pediatric): Secondary | ICD-10-CM

## 2019-08-10 DIAGNOSIS — I69359 Hemiplegia and hemiparesis following cerebral infarction affecting unspecified side: Secondary | ICD-10-CM | POA: Diagnosis not present

## 2019-08-10 DIAGNOSIS — G801 Spastic diplegic cerebral palsy: Secondary | ICD-10-CM | POA: Diagnosis not present

## 2019-08-10 DIAGNOSIS — F418 Other specified anxiety disorders: Secondary | ICD-10-CM

## 2019-08-10 DIAGNOSIS — F902 Attention-deficit hyperactivity disorder, combined type: Secondary | ICD-10-CM | POA: Diagnosis not present

## 2019-08-10 DIAGNOSIS — Q681 Congenital deformity of finger(s) and hand: Secondary | ICD-10-CM

## 2019-08-10 DIAGNOSIS — F5081 Binge eating disorder: Secondary | ICD-10-CM

## 2019-08-10 DIAGNOSIS — M216X2 Other acquired deformities of left foot: Secondary | ICD-10-CM

## 2019-08-10 DIAGNOSIS — F639 Impulse disorder, unspecified: Secondary | ICD-10-CM

## 2019-08-10 DIAGNOSIS — M24522 Contracture, left elbow: Secondary | ICD-10-CM

## 2019-08-10 DIAGNOSIS — F3481 Disruptive mood dysregulation disorder: Secondary | ICD-10-CM

## 2019-08-10 MED ORDER — LISDEXAMFETAMINE DIMESYLATE 60 MG PO CAPS: 60.0000 mg | ORAL_CAPSULE | ORAL | 0 refills | Status: DC

## 2019-08-10 MED ORDER — LAMOTRIGINE 150 MG PO TABS: 150.0000 mg | ORAL_TABLET | Freq: Three times a day (TID) | ORAL | 2 refills | Status: DC

## 2019-08-10 MED ORDER — FLUOXETINE HCL 40 MG PO CAPS: 40.0000 mg | ORAL_CAPSULE | Freq: Every day | ORAL | 3 refills | Status: DC

## 2019-08-10 MED ORDER — FLUOXETINE HCL 20 MG PO CAPS: 20.0000 mg | ORAL_CAPSULE | Freq: Every day | ORAL | 3 refills | Status: DC

## 2019-08-10 NOTE — Progress Notes (Addendum)
BH MD/PA/NP OP Progress Note  08/10/2019 5:00 PM Diane Barr  MRN:  [id number] Virtual Visit via Video Note  I connected with Diane Barr on 08/10/19 at  1:30 PM EDT by a video enabled telemedicine application and verified that I am speaking with the correct person using two identifiers.  Pt was contacted from Clinic thru  Video My Chart at her home   I discussed the limitations of evaluation and management by telemedicine and the availability of in person appointments. The patient expressed understanding and agreed to proceed.   History of Present Illness:See EPIC note    Observations/Objective:See EPIC note   Assessment and Plan:See EPIC note   Follow Up Instructions:See EPIC note   I discussed the assessment and treatment plan with the patient. The patient was provided an opportunity to ask questions and all were answered. The patient agreed with the plan and demonstrated an understanding of the instructions.   The patient was advised to call back or seek an in-person evaluation if the symptoms worsen or if the condition fails to improve as anticipated.  I provided 20 minutes of non-face-to-face time during this encounter.   Maryjean Morn, PA-C   Chief Complaint:  Chief Complaint    Follow-up; Autism; ADHD; Agitation; Obesity; Cerebrovascular Accident; Cerebral Palsy     HPI: Diane Barr is seen for her 3 month FU and Medication management/rfills for her Autsim/ADHD/Anxiuos depression having CP from CVA at birth with multiple physical complicatios as noted in Visit diagnoses. She remains at home disabled but functional part of family. No recent episodes of DMDD.Looking forward to new niece arriving  She has been vaccinated against COVID .She has followed up with specialists by Virtual visits except for  Botox injections.  Visit Diagnosis:    ICD-10-CM   1. History of autism spectrum disorder  F84.0   2. ADHD (attention deficit hyperactivity disorder),  combined type  F90.2   3. CVA, old, hemiparesis (HCC)  I69.359   4. CP (cerebral palsy), spastic (HCC)  G80.1   5. DMDD (disruptive mood dysregulation disorder) (HCC)  F34.81   6. Partial epilepsy (HCC)  G40.109   7. Acquired equinus deformity of left foot  M21.6X2   8. Contracture of left elbow  M24.522   9. 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 Impulse control disorder  F63.9   11. Binge eating disorder  F50.81   12. Obstructive sleep apnea syndrome  G47.33   13. BMI (body mass index), pediatric, greater than 99% for age  Z71.54   109. Other migraine without status migrainosus, not intractable  G43.809   15. 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 Acquired equinus deformity of left foot    CP related  . Acquired pes planovalgus of left foot    CP related  . ADHD (attention deficit hyperactivity disorder)   . Anxiety   . CP (cerebral palsy), spastic (HCC) birth   CVA related  . CVA (cerebrovascular accident due to intracerebral hemorrhage) (HCC) at birth  . CVA, old, hemiparesis (HCC) birth   CP  . Depression   . Epilepsy, localization-related (HCC)    with complex partial seizures,without mention of intractable epilepsy  . Flexion deformity    Lt upper extremity  . 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 multiple  repairs on arm and leg    [facility] 07/24/2019 [facility]  Diagnosis: G80.2 (Spastic Hemiplegic cerbral palsy)  J Code: [id number]  Procedure Code: 70177 - one extremity, 5 or more muscles and 93903 - each additional extremity, 5 or more muscles; num    Family History:  Family History  Problem Relation Age of Onset  . ADD / ADHD Father   . ADD / ADHD Sister     Social History:  Social History   Socioeconomic History  . Marital status: Single    Spouse name: Not on  file  . Number of children: Not on file  . Years of education: Not on file  . Highest education level: Not on file  Occupational History  . Not on file  Tobacco Use  . Smoking status: Never Smoker  . Smokeless tobacco: Never Used  Substance and Sexual Activity  . Alcohol use: No  . Drug use: No  . Sexual activity: Never  Other Topics Concern  . Not on file  Social History Narrative  . Not on file   Allergies:  Allergies  Allergen Reactions  . Peanuts [Peanut Oil] Anaphylaxis  . Other Other (See Comments)    Anti Psychotics induce seizures PLUMS, APPLES & GRAPES, WITH SEEDSNFoodmouth tingles Anti Psychotics induce seizures Anti Psychotics induce seizures PLUMS, APPLES & GRAPES, WITH SEEDSNFoodmouth tingles Anti Psychotics induce seizures    Metabolic Disorder Labs: No results found for: HGBA1C, MPG No results found for: PROLACTIN No results found for: CHOL, TRIG, HDL, CHOLHDL, VLDL, LDLCALC No results found for: TSH  Therapeutic Level Labs: seizures   Glucose 82 65 - 99 mg/dL        Lipid Panel  Component Value Ref Range Performed At  Cholesterol, Total 163 100 - 169 mg/dL [facility]  Triglycerides 85 0 - 89 mg/dL [facility]  HDL 42 >39 mg/dL [facility]  VLDL Cholesterol Cal 17 5 - 40 mg/dL [facility]  LDL Calculated 104 0 - 109 mg/dL [facility]         TSH,Ultra Sensitive  Component Value Ref Range Performed At  TSH 3.656 0.40 - 5.50 uIU/ML SUNQUEST    Free Thyroxine (Free T4)      Free Thyroxine (Free T4)  Component Value Ref Range Performed At  FREE THYROXINE 0.9 0.6 - 1.75 NG/DL SUNQUEST    Current Medications: Current Outpatient Medications  Medication Sig Dispense Refill  . BOTOX 100 units SOLR injection     . FLUCELVAX QUADRIVALENT 0.5 ML SUSY INJECT 0.5ML INTRAMUSCULARLY ONCE  0  . FLUoxetine (PROZAC) 20 MG capsule Take 1 capsule (20 mg total) by mouth daily. TAKE 1 CAPSULE DAILY (TAKE WITH 40 MG CAPSULE FOR TOTAL OF 60 MG  DAILY) 90 capsule 3  . FLUoxetine (PROZAC) 40 MG capsule Take 1 capsule (40 mg total) by mouth daily. 90 capsule 3  . ketorolac (TORADOL) 10 MG tablet Take by mouth.    . lamoTRIgine (LAMICTAL) 150 MG tablet Take 1 tablet (150 mg total) by mouth 3 (three) times daily. 270 tablet 2  . lisdexamfetamine (VYVANSE) 60 MG capsule Take 1 capsule (60 mg total) by mouth every morning. 30 capsule 0  . lisdexamfetamine (VYVANSE) 60 MG capsule Take 1 capsule (60 mg total) by mouth every morning. DNF Before 10/04/2019 30 capsule 0  . lisdexamfetamine (VYVANSE) 60 MG capsule Take 1 capsule (60 mg total) by mouth every morning. DNFBefore 09/04/2019 30 capsule 0  . midazolam (  VERSED) 5 MG/ML injection SPRAY 1 ML IN EACH NOSTRIL AS NEEDED FOR SEIZURES LASTING GREATER THAN 5 MINUTES    . naproxen sodium (ANAPROX) 550 MG tablet TAKE 1 TABLET WITH COMPAZINE FOR SEVERE HEADACHES MAY REPEAT IN 6 HOURS    . Oxcarbazepine (TRILEPTAL) 300 MG tablet Take by mouth.    . prochlorperazine (COMPAZINE) 10 MG tablet Take 10 mg by mouth as needed for headache, seizure or headache. Take with Naprosuyn 550 mg    . prochlorperazine (COMPAZINE) 10 MG tablet Take 1 tablet with 1 naproxen 550 for severe headache.    . promethazine (PHENERGAN) 25 MG tablet Take by mouth.    . SUMAtriptan (IMITREX) 50 MG tablet Take by mouth.    . topiramate (TOPAMAX) 100 MG tablet Take 1 tablet (100 mg total) by mouth 2 (two) times daily for 360 doses. 180 tablet 1  . triamcinolone cream (KENALOG) 0.1 % Apply topically.     No current facility-administered medications for this visit.   COVID-19 vac,Ad26(Janssen)-PF injection Susp 0.5 mL  0.5 mL Once, Intramuscular, Sun 05/21/19 at 1119, For 1 dose  Given 05/21/2019 11:18 AM EDT 0.5 mLs  Right Deltoid     Musculoskeletal: Strength & Muscle Tone: Telepsych visit-limited view-chronic CP chages per Visit Diagnoses  Gait & Station: NA Patient leans: N/A Psychiatric Specialty Exam: Review of Systems   Constitutional: Negative for activity change, appetite change, chills, diaphoresis, fatigue, fever and unexpected weight change.  Genitourinary: Negative for decreased urine volume and vaginal discharge.  Musculoskeletal: Positive for arthralgias, gait problem and myalgias. Negative for joint swelling.  Neurological: Positive for seizures (medictions controlling) and headaches. Negative for dizziness, tremors, syncope, facial asymmetry, speech difficulty, weakness, light-headedness and numbness.  Hematological: Negative for adenopathy. Does not bruise/bleed easily.  Psychiatric/Behavioral: Negative for agitation, behavioral problems, confusion, decreased concentration, dysphoric mood, hallucinations, self-injury, sleep disturbance and suicidal ideas. The patient is nervous/anxious (at goal with meds). The patient is not hyperactive.        Autism/ADHD/ODD_DMDD    There were no vitals taken for this visit.There is no height or weight on file to calculate BMI.MY CHART Visit  General Appearance: Neat and Well Groomed  Eye Contact:  Good  Speech:  Clear and Coherent  Volume:  Normal  Mood:  Happy  Affect:  Appropriate and Congruent  Thought Process:  Coherent and Descriptions of Associations: Intact  Orientation:  Full (Time, Place, and Person)  Thought Content: WDL   Suicidal Thoughts:  No  Homicidal Thoughts:  No  Memory:  Negative  Judgement:  Fair  Insight:  limited/WDL  Psychomotor Activity:  CP /CVA Impairments  Concentration:  Concentration: Good and Attention Span: Good  Recall:  Negative  Fund of Knowledge: WDL  Language: WDL  Akathisia:  Negative  Handed:  Right  AIMS (if indicated): na  Assets:  Architect Housing Resilience Social Support Talents/Skills Transportation  ADL's:  Intact  Cognition: Impaired,  Moderate  Sleep:  No Complaint     Assessment : Stable  and Plan: Continue current medications.FU 3 months-sooner if  needed   Maryjean Morn, PA-C 08/10/2019, 5:00 PM

## 2019-08-23 NOTE — Progress Notes (Signed)
[facility] Scheduled my chart visit

## 2019-11-09 ENCOUNTER — Telehealth (INDEPENDENT_AMBULATORY_CARE_PROVIDER_SITE_OTHER): Payer: 59 | Admitting: Medical

## 2019-11-09 ENCOUNTER — Encounter (HOSPITAL_COMMUNITY): Payer: Self-pay | Admitting: Medical

## 2019-11-09 DIAGNOSIS — G801 Spastic diplegic cerebral palsy: Secondary | ICD-10-CM

## 2019-11-09 DIAGNOSIS — G43809 Other migraine, not intractable, without status migrainosus: Secondary | ICD-10-CM

## 2019-11-09 DIAGNOSIS — F840 Autistic disorder: Secondary | ICD-10-CM

## 2019-11-09 DIAGNOSIS — F639 Impulse disorder, unspecified: Secondary | ICD-10-CM

## 2019-11-09 DIAGNOSIS — I69359 Hemiplegia and hemiparesis following cerebral infarction affecting unspecified side: Secondary | ICD-10-CM

## 2019-11-09 DIAGNOSIS — F902 Attention-deficit hyperactivity disorder, combined type: Secondary | ICD-10-CM

## 2019-11-09 DIAGNOSIS — F3481 Disruptive mood dysregulation disorder: Secondary | ICD-10-CM

## 2019-11-09 DIAGNOSIS — G4733 Obstructive sleep apnea (adult) (pediatric): Secondary | ICD-10-CM

## 2019-11-09 DIAGNOSIS — M216X2 Other acquired deformities of left foot: Secondary | ICD-10-CM

## 2019-11-09 DIAGNOSIS — G40109 Localization-related (focal) (partial) symptomatic epilepsy and epileptic syndromes with simple partial seizures, not intractable, without status epilepticus: Secondary | ICD-10-CM

## 2019-11-09 DIAGNOSIS — F5081 Binge eating disorder: Secondary | ICD-10-CM

## 2019-11-09 DIAGNOSIS — M24522 Contracture, left elbow: Secondary | ICD-10-CM

## 2019-11-09 DIAGNOSIS — Q681 Congenital deformity of finger(s) and hand: Secondary | ICD-10-CM

## 2019-11-09 DIAGNOSIS — Z68.41 Body mass index (BMI) pediatric, greater than or equal to 95th percentile for age: Secondary | ICD-10-CM

## 2019-11-09 DIAGNOSIS — F418 Other specified anxiety disorders: Secondary | ICD-10-CM

## 2019-11-09 MED ORDER — FLUOXETINE HCL 40 MG PO CAPS: 40.0000 mg | ORAL_CAPSULE | Freq: Every day | ORAL | 3 refills | Status: AC

## 2019-11-09 MED ORDER — LAMOTRIGINE 150 MG PO TABS: 150.0000 mg | ORAL_TABLET | Freq: Three times a day (TID) | ORAL | 2 refills | Status: AC

## 2019-11-09 MED ORDER — LISDEXAMFETAMINE DIMESYLATE 60 MG PO CAPS: 60.0000 mg | ORAL_CAPSULE | ORAL | 0 refills | Status: AC

## 2019-11-09 MED ORDER — TOPIRAMATE 100 MG PO TABS: 100.0000 mg | ORAL_TABLET | Freq: Two times a day (BID) | ORAL | 1 refills | Status: AC

## 2019-11-09 MED ORDER — FLUOXETINE HCL 20 MG PO CAPS: 20.0000 mg | ORAL_CAPSULE | Freq: Every day | ORAL | 3 refills | Status: AC

## 2019-11-09 NOTE — Progress Notes (Signed)
BH MD/PA/NP OP Progress Note  11/09/2019 6:02 PM Cambelle Akelia Husted  MRN:  [id number]    Virtual Visit via Video Note              I connected with [name] on 11/09/19 at  4:00 PM EDT  by [facility] CHART enabled telemedicine application from [facility] @ [facility] and verified that I am speaking with Kaden using two identifiers.   I discussed the assessment and treatment plan with the patient. The patient was provided an opportunity to ask questions and all were answered. The patient agreed with the plan and demonstrated an understanding of the instructions.   History of Present Illness:See EPIC note     Observations/Objective:See EPIC note     Assessment and Plan:See EPIC note     Follow Up Instructions:See EPIC note   The patient was advised to call back or seek an in-person evaluation if the symptoms worsen or if the condition fails to improve as anticipated.   I provided  20  minutes of non-face-to-face time during this encounter.      Chief Complaint:  Chief Complaint    Follow-up; Medication Refill; CP/CVA; LT HEMIPLEGIA; Autism; ADHD; Agitation; Anxiety; Obesity     HPI: Hollis reports for her 3 month FU with her dad.She reports no changes nor problems She has had a quiet summer.She does need refills. Visit Diagnosis:    ICD-10-CM   1. History of autism spectrum disorder  F84.0   2. ADHD (attention deficit hyperactivity disorder), combined type  F90.2   3. CVA, old, hemiparesis (HCC)  I69.359   4. CP (cerebral palsy), spastic (HCC)  G80.1   5. DMDD (disruptive mood dysregulation disorder) (HCC)  F34.81   6. Partial epilepsy (HCC)  G40.109   7. Acquired equinus deformity of left foot  M21.6X2   8. Contracture of left elbow  M24.522   9. 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 Impulse control disorder  F63.9   11. Binge eating disorder  F50.81   12. Obstructive sleep apnea syndrome  G47.33   13. BMI (body mass index), pediatric, greater than 99% for  age  Z91.54   49. Other migraine without status migrainosus, not intractable  G43.809   15. 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  [place], [place] [address]  [phone]  Wells Guiles, MD  [address]  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 Acquired flexion deformity of elbow, left M21.922  3. Thumb in palm deformity; left Q74.0  4. Acquired equinovalgus deformity; left M21.6X9  5. Acrocyanosis lower exremity; left (HCC) I73.89  Plan: observe acrocyanosis - if increases spine x-rays   RECOMMENDATIONS: Based upon the current history, evaluation and co morbidities; additional toxin injections are appropriate and advidsed. The potential options including Surgery, bracing or oral medications for spasticity are not recommended. Injection  Botulinum toxin Injection (office)  Procedure Note: Time out performed and verbal consent obtained -- universal protocol followed  Under sterile conditions after hibiclens skin cleansing and  preparation, a total of400 units of Botox (10 cc) was injected without complication using palpation under stretch and anatomic landmarks  Details of muscles injected include:  Upper extremity: left pectoralis major: latissimus dorsi; Biceps/brachialis;; flexor pronator mass; adductor and first dorsal interosseius   Lower extremity: left adductors - x 3 gastrocsoleus medial ; and lateral; soleus ; semimenbranosis and semitendinosis   Past Medical History:  Diagnosis Date    Acquired equinus deformity of left foot    CP related   Acquired pes planovalgus of left foot    CP related   ADHD (attention deficit hyperactivity disorder)    Anxiety    CP (cerebral palsy), spastic (HCC) birth   CVA related   CVA (cerebrovascular accident due to intracerebral hemorrhage) (HCC) at birth   CVA, old, hemiparesis (HCC) birth   CP   Depression    Epilepsy, localization-related (HCC)    with complex partial seizures,without mention of intractable epilepsy   Flexion deformity    Lt upper extremity   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 CVA with CP    Past Surgical History:  Procedure Laterality Date   multiple repairs on arm and leg      Allergies:  Allergies  Allergen Reactions   Peanuts [Peanut Oil] Anaphylaxis   Other Other (See Comments)    Anti Psychotics induce seizures PLUMS, APPLES & GRAPES, WITH SEEDSNFoodmouth tingles Anti Psychotics induce seizures Anti Psychotics induce seizures PLUMS, APPLES & GRAPES, WITH SEEDSNFoodmouth tingles Anti Psychotics induce seizures    Metabolic Disorder Labs:  Metabolic Disorder Labs: Glucose 82 65 - 99 mg/dL        Lipid Panel  Component Value Ref Range Performed At  Cholesterol, Total 163 100 - 169 mg/dL [facility]  Triglycerides 85 0 - 89 mg/dL [facility]  HDL 42 >09>39 mg/dL [facility]  VLDL Cholesterol Cal 17 5 - 40 mg/dL [facility]  LDL Calculated 104 0 - 109 mg/dL [facility]      Lipid Panel  Narrative    Recent Labs  No results found for: TSH  TSH,Ultra Sensitive      TSH,Ultra Sensitive  Component Value Ref Range Performed At  TSH 3.656 0.40 - 5.50 uIU/ML [facility]   TSH,Ultra Sensitive  Specimen  Blood        TSH,Ultra Sensitive  Performing Organization Address City/State/Zipcode Phone Number  [facility]  Clinical [facility], CLIA# [id number], [address]  [place], [place] [id number]     [facility]  [facility], [facility] [address]  [place], [place] [address]     Free Thyroxine (Free T4)      Free Thyroxine (Free T4)  Component Value Ref Range Performed At  FREE THYROXINE 0.9 0.6 - 1.75 NG/DL [facility]   Free Thyroxine (Free T4)  Specimen    Therapeutic Level Labs:na  Current Medications: Current Outpatient Medications  Medication Sig Dispense Refill   BOTOX 100 units SOLR injection      FLUCELVAX QUADRIVALENT 0.5 ML SUSY INJECT 0.5ML INTRAMUSCULARLY ONCE  0   FLUoxetine (PROZAC) 20 MG capsule Take 1 capsule (20 mg total) by mouth daily. TAKE 1 CAPSULE DAILY (TAKE WITH 40 MG CAPSULE FOR TOTAL OF 60 MG DAILY) 90 capsule 3   FLUoxetine (PROZAC) 40 MG capsule Take 1 capsule (40 mg total) by mouth daily. 90 capsule 3   ketorolac (TORADOL) 10 MG tablet Take by mouth.     lamoTRIgine (LAMICTAL)  150 MG tablet Take 1 tablet (150 mg total) by mouth 3 (three) times daily. 270 tablet 2   lisdexamfetamine (VYVANSE) 60 MG capsule Take 1 capsule (60 mg total) by mouth every morning. 30 capsule 0   lisdexamfetamine (VYVANSE) 60 MG capsule Take 1 capsule (60 mg total) by mouth every morning. DNF Before 12/08/2019 30 capsule 0   lisdexamfetamine (VYVANSE) 60 MG capsule Take 1 capsule (60 mg total) by mouth every morning. DNFBefore 01/08/2020 30 capsule 0   midazolam (VERSED) 5 MG/ML injection SPRAY 1 ML IN EACH NOSTRIL AS NEEDED FOR SEIZURES LASTING GREATER THAN 5 MINUTES     naproxen sodium (ANAPROX) 550 MG tablet TAKE 1 TABLET WITH COMPAZINE FOR SEVERE HEADACHES MAY REPEAT IN 6 HOURS     Oxcarbazepine (TRILEPTAL) 300 MG tablet Take by mouth.     prochlorperazine (COMPAZINE) 10 MG tablet Take 10 mg by mouth as needed for headache, seizure or headache. Take with Naprosuyn 550 mg     prochlorperazine (COMPAZINE) 10 MG tablet Take 1 tablet with 1 naproxen 550 for severe headache.     promethazine (PHENERGAN) 25 MG tablet Take by mouth.      SUMAtriptan (IMITREX) 50 MG tablet Take by mouth.     topiramate (TOPAMAX) 100 MG tablet Take 1 tablet (100 mg total) by mouth 2 (two) times daily for 360 doses. 180 tablet 1   triamcinolone cream (KENALOG) 0.1 % Apply topically.     No current facility-administered medications for this visit.   Musculoskeletal: Strength & Muscle Tone: Telepsych visit-Grossly normal Musculoskeletal and cranial nerve inspections Gait & Station: NA Patient leans: N/APsychiatric Specialty Exam: Review of Systems  Constitutional: Negative for activity change, appetite change, chills, diaphoresis, fatigue, fever and unexpected weight change.  Genitourinary: Negative for vaginal discharge.  Musculoskeletal: Positive for gait problem.        1. 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 Acquired flexion deformity of elbow, left M21.922  3. Thumb in palm deformity; left Q74.0  4. Acquired equinovalgus deformity; left M21.6X9  5. Acrocyanosis lower exremity; left (HCC) I73.89   Neurological: Positive for seizures, facial asymmetry, weakness and headaches. Negative for dizziness, tremors, syncope, speech difficulty, light-headedness and numbness.       CP/CVA   1. 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 Acquired flexion deformity of elbow, left M21.922  3. Thumb in palm deformity; left Q74.0  4. Acquired equinovalgus deformity; left M21.6X9  5. Acrocyanosis lower exremity; left (HCC) I73.89   Psychiatric/Behavioral: Positive for agitation, behavioral problems, decreased concentration and sleep disturbance. Negative for confusion, dysphoric mood, hallucinations, self-injury and suicidal ideas. The patient is nervous/anxious. The patient is not hyperactive.     There were no vitals taken for this visit.There is no height or weight on file to calculate BMI.MY CHART VISIT  General Appearance: Casual and Well Groomed  Eye Contact:  Good   Speech:  Clear and Coherent and Normal Rate  Volume:  Normal  Mood:  Euthymic  Affect:  Congruent  Thought Process:  Coherent and Descriptions of Associations: Intact  Orientation:  Full (Time, Place, and Person)  Thought Content: WDL   Suicidal Thoughts:  No  Homicidal Thoughts:  No  Memory:  Negative  Judgement:  Fair  Insight:  Fair  Psychomotor Activity:  Psychomotor Retardation  Concentration:  Concentration: Good and Attention Span: Good  Recall:  Good  Fund of Knowledge: wdl  Language: wdl  Akathisia:  NA  Handed:  Right  AIMS (if indicated): NA  Assets:  Financial Resources/Insurance Housing Resilience Social Support Transportation  ADL's:  Impaired  Cognition: Impaired,  Severe  Sleep:  Negative     Assessment Stable   and Plan: Continue current plan.FU 3 months sooner if needed  Maryjean Morn, PA-C 11/09/2019, 6:02 PM

## 2020-04-16 ENCOUNTER — Other Ambulatory Visit (HOSPITAL_COMMUNITY): Payer: Self-pay | Admitting: Medical

## 2020-06-29 ENCOUNTER — Other Ambulatory Visit (HOSPITAL_COMMUNITY): Payer: Self-pay | Admitting: Medical

## 2020-10-10 ENCOUNTER — Other Ambulatory Visit (HOSPITAL_COMMUNITY): Payer: Self-pay | Admitting: Medical

## 2020-10-17 ENCOUNTER — Other Ambulatory Visit (HOSPITAL_COMMUNITY): Payer: Self-pay | Admitting: Medical

## 2020-10-17 NOTE — Telephone Encounter (Signed)
Pt not seen since Sept last year

## 2020-11-11 ENCOUNTER — Other Ambulatory Visit (HOSPITAL_COMMUNITY): Payer: Self-pay | Admitting: Medical

## 2020-11-14 NOTE — Telephone Encounter (Signed)
Needs appointment
